# Patient Record
Sex: Female | Born: 1970 | Race: Black or African American | Hispanic: No | Marital: Single | Smoking: Never smoker
Health system: Southern US, Community
[De-identification: ages and names within clinical notes are randomized; demographics above are authoritative.]

## PROBLEM LIST (undated history)

## (undated) DIAGNOSIS — Z806 Family history of leukemia: Secondary | ICD-10-CM

## (undated) DIAGNOSIS — Z8041 Family history of malignant neoplasm of ovary: Secondary | ICD-10-CM

## (undated) DIAGNOSIS — Z800 Family history of malignant neoplasm of digestive organs: Secondary | ICD-10-CM

## (undated) DIAGNOSIS — E785 Hyperlipidemia, unspecified: Secondary | ICD-10-CM

## (undated) DIAGNOSIS — Z807 Family history of other malignant neoplasms of lymphoid, hematopoietic and related tissues: Secondary | ICD-10-CM

## (undated) DIAGNOSIS — D649 Anemia, unspecified: Secondary | ICD-10-CM

## (undated) DIAGNOSIS — Z803 Family history of malignant neoplasm of breast: Secondary | ICD-10-CM

## (undated) HISTORY — PX: ROOT CANAL: SHX2363

## (undated) HISTORY — DX: Family history of malignant neoplasm of digestive organs: Z80.0

## (undated) HISTORY — DX: Family history of other malignant neoplasms of lymphoid, hematopoietic and related tissues: Z80.7

## (undated) HISTORY — DX: Family history of malignant neoplasm of ovary: Z80.41

## (undated) HISTORY — DX: Anemia, unspecified: D64.9

## (undated) HISTORY — DX: Hyperlipidemia, unspecified: E78.5

## (undated) HISTORY — DX: Family history of leukemia: Z80.6

## (undated) HISTORY — DX: Family history of malignant neoplasm of breast: Z80.3

## (undated) HISTORY — PX: PARTIAL HYSTERECTOMY: SHX80

---
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
## 1997-10-01 DIAGNOSIS — I82409 Acute embolism and thrombosis of unspecified deep veins of unspecified lower extremity: Secondary | ICD-10-CM

## 1997-10-01 HISTORY — DX: Acute embolism and thrombosis of unspecified deep veins of unspecified lower extremity: I82.409

## 2020-08-01 DIAGNOSIS — T7840XA Allergy, unspecified, initial encounter: Secondary | ICD-10-CM

## 2020-08-01 HISTORY — DX: Allergy, unspecified, initial encounter: T78.40XA

## 2020-08-30 ENCOUNTER — Other Ambulatory Visit: Payer: Self-pay

## 2020-08-30 ENCOUNTER — Encounter: Payer: Self-pay | Admitting: Allergy and Immunology

## 2020-08-30 ENCOUNTER — Ambulatory Visit: Payer: 59 | Admitting: Allergy and Immunology

## 2020-08-30 VITALS — BP 130/76 | HR 87 | Temp 97.9°F | Resp 16 | Ht 68.0 in | Wt 231.6 lb

## 2020-08-30 DIAGNOSIS — L299 Pruritus, unspecified: Secondary | ICD-10-CM

## 2020-08-30 DIAGNOSIS — T7800XA Anaphylactic reaction due to unspecified food, initial encounter: Secondary | ICD-10-CM

## 2020-08-30 MED ORDER — EPINEPHRINE 0.3 MG/0.3ML IJ SOAJ: 0.3000 mg | Freq: Once | INTRAMUSCULAR | 1 refills | Status: AC

## 2020-08-30 NOTE — Patient Instructions (Addendum)
Food allergy The patient's history suggests food allergy and positive skin test results today confirm this diagnosis.  Food allergen skin tests were reactive to soybean, walnut, almond, and hazelnut.  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  A prescription has been provided for epinephrine auto-injector 2 pack along with instructions for proper administration.  A food allergy action plan has been provided and discussed.  Medic Alert identification is recommended.   Return in about 1 year (around 08/30/2021), or if symptoms worsen or fail to improve.

## 2020-08-30 NOTE — Progress Notes (Signed)
New Patient Note  RE: Sandra Kelley MRN: [id number] DOB: 15-May-1971 Date of Office Visit: 08/30/2020  Referring provider: Jackie Plum, MD Primary care provider: Jackie Plum, MD  Chief Complaint: Allergic Reaction   History of present illness: Sandra Kelley is a 49 y.o. female seen today in consultation requested by Sandra Plum, MD.  She reports that she has been attempting to change her diet as she is close to turning 49 years old.  Considering these changes in her diet, she is interested in assessing her food allergy status with skin testing.  She reports that for many years she has experienced "extreme" abdominal discomfort and bloating when consuming soy.  Therefore, she has removed soy from her diet.  She denies concomitant urticaria, angioedema, cardiopulmonary symptoms, nausea, and vomiting.  She also reports that when she consumes tree nuts she experiences oral pruritus and her hard palate "bumps up."  At times, the symptoms become severe and she needs to rinse out her mouth with mouthwash.  For the last month, she has eliminated carbohydrates from her diet and will be introducing more whole grains and oats into her diet and would like to be tested for those as well.  Assessment and plan: Food allergy The patient's history suggests food allergy and positive skin test results today confirm this diagnosis.  Food allergen skin tests were reactive to soybean, walnut, almond, and hazelnut.  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  A prescription has been provided for epinephrine auto-injector 2 pack along with instructions for proper administration.  A food allergy action plan has been provided and discussed.  Medic Alert identification is recommended.   Meds ordered this encounter  Medications  . EPINEPHrine 0.3 mg/0.3 mL IJ SOAJ injection    Sig: Inject 0.3 mg into the muscle once for 1 dose.    Dispense:  1 each    Refill:  1     Diagnostics:  Food allergen skin testing: Reactive to soybean, walnut, almond, and hazelnut.    Physical examination: Blood pressure 130/76, pulse 87, temperature 97.9 F (36.6 C), temperature source Temporal, resp. rate 16, height 5\' 8"  (1.727 m), weight 231 lb 9.6 oz (105.1 kg), SpO2 100 %.  General: Alert, interactive, in no acute distress. Neck: Supple without lymphadenopathy. Lungs: Clear to auscultation without wheezing, rhonchi or rales. CV: Normal S1, S2 without murmurs. Abdomen: Nondistended, nontender. Skin: Warm and dry, without lesions or rashes. Extremities:  No clubbing, cyanosis or edema. Neuro:   Grossly intact.  Review of systems:  Review of systems negative except as noted in HPI / PMHx or noted below: Review of Systems  Constitutional: Negative.   HENT: Negative.   Eyes: Negative.   Respiratory: Negative.   Cardiovascular: Negative.   Gastrointestinal: Negative.   Genitourinary: Negative.   Musculoskeletal: Negative.   Skin: Negative.   Neurological: Negative.   Endo/Heme/Allergies: Negative.   Psychiatric/Behavioral: Negative.     Past medical history:  History reviewed. No pertinent past medical history.  Past surgical history:  Past Surgical History:  Procedure Laterality Date  . PARTIAL HYSTERECTOMY    . ROOT CANAL      Family history: Family History  Problem Relation Age of Onset  . Allergic rhinitis Sister   . Food Allergy Sister   . Asthma Neg Hx   . Angioedema Neg Hx   . Eczema Neg Hx   . Immunodeficiency Neg Hx   . Urticaria Neg Hx     Social history:  Social History   Socioeconomic History  . Marital status: Single    Spouse name: Not on file  . Number of children: Not on file  . Years of education: Not on file  . Highest education level: Not on file  Occupational History  . Not on file  Tobacco Use  . Smoking status: Never Smoker  . Smokeless tobacco: Never Used  Vaping Use  . Vaping Use: Never used  Substance  and Sexual Activity  . Alcohol use: Never  . Drug use: Never  . Sexual activity: Not on file  Other Topics Concern  . Not on file  Social History Narrative  . Not on file   Social Determinants of Health   Financial Resource Strain:   . Difficulty of Paying Living Expenses: Not on file  Food Insecurity:   . Worried About Programme researcher, broadcasting/film/video in the Last Year: Not on file  . Ran Out of Food in the Last Year: Not on file  Transportation Needs:   . Lack of Transportation (Medical): Not on file  . Lack of Transportation (Non-Medical): Not on file  Physical Activity:   . Days of Exercise per Week: Not on file  . Minutes of Exercise per Session: Not on file  Stress:   . Feeling of Stress : Not on file  Social Connections:   . Frequency of Communication with Friends and Family: Not on file  . Frequency of Social Gatherings with Friends and Family: Not on file  . Attends Religious Services: Not on file  . Active Member of Clubs or Organizations: Not on file  . Attends Banker Meetings: Not on file  . Marital Status: Not on file  Intimate Partner Violence:   . Fear of Current or Ex-Partner: Not on file  . Emotionally Abused: Not on file  . Physically Abused: Not on file  . Sexually Abused: Not on file    Environmental History: The patient lives in a 49 year old house with laminate floors throughout and central air/heat.  There is mold/water damage in the home.  There are no pets in the home.  She is a never smoker.  Current Outpatient Medications  Medication Sig Dispense Refill  . Melatonin 1 MG CHEW Chew by mouth daily as needed.    . phentermine (ADIPEX-P) 37.5 MG tablet Take 37.5 mg by mouth at bedtime.    . Vitamin D, Ergocalciferol, (DRISDOL) 1.25 MG (50000 UNIT) CAPS capsule Take 50,000 Units by mouth once a week.    Marland Kitchen EPINEPHrine 0.3 mg/0.3 mL IJ SOAJ injection Inject 0.3 mg into the muscle once for 1 dose. 1 each 1   No current facility-administered  medications for this visit.    Known medication allergies: Allergies  Allergen Reactions  . Demerol [Meperidine Hcl] Itching    I appreciate the opportunity to take part in [facility] care. Please do not hesitate to contact me with questions.  Sincerely,   R. Jorene Guest, MD

## 2020-08-30 NOTE — Assessment & Plan Note (Addendum)
The patient's history suggests food allergy and positive skin test results today confirm this diagnosis.  Food allergen skin tests were reactive to soybean, walnut, almond, and hazelnut.  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  A prescription has been provided for epinephrine auto-injector 2 pack along with instructions for proper administration.  A food allergy action plan has been provided and discussed.  Medic Alert identification is recommended.

## 2020-09-02 ENCOUNTER — Other Ambulatory Visit: Payer: Self-pay | Admitting: Obstetrics and Gynecology

## 2020-09-02 DIAGNOSIS — N6320 Unspecified lump in the left breast, unspecified quadrant: Secondary | ICD-10-CM

## 2020-09-13 ENCOUNTER — Other Ambulatory Visit: Payer: 59

## 2020-09-19 ENCOUNTER — Ambulatory Visit
Admission: RE | Admit: 2020-09-19 | Discharge: 2020-09-19 | Disposition: A | Discharge status: Home / Self Care | Payer: 59 | Source: Ambulatory Visit | Attending: Obstetrics and Gynecology | Admitting: Obstetrics and Gynecology

## 2020-09-19 ENCOUNTER — Other Ambulatory Visit: Payer: Self-pay | Admitting: Obstetrics and Gynecology

## 2020-09-19 ENCOUNTER — Other Ambulatory Visit: Payer: Self-pay

## 2020-09-19 DIAGNOSIS — N6320 Unspecified lump in the left breast, unspecified quadrant: Secondary | ICD-10-CM

## 2020-10-09 ENCOUNTER — Emergency Department (HOSPITAL_BASED_OUTPATIENT_CLINIC_OR_DEPARTMENT_OTHER)
Admission: EM | Admit: 2020-10-09 | Discharge: 2020-10-09 | Disposition: A | Discharge status: Home / Self Care | Payer: 59 | Attending: Emergency Medicine | Admitting: Emergency Medicine

## 2020-10-09 ENCOUNTER — Other Ambulatory Visit: Payer: Self-pay

## 2020-10-09 ENCOUNTER — Encounter (HOSPITAL_BASED_OUTPATIENT_CLINIC_OR_DEPARTMENT_OTHER): Payer: Self-pay

## 2020-10-09 DIAGNOSIS — S161XXA Strain of muscle, fascia and tendon at neck level, initial encounter: Secondary | ICD-10-CM | POA: Insufficient documentation

## 2020-10-09 DIAGNOSIS — S199XXA Unspecified injury of neck, initial encounter: Secondary | ICD-10-CM | POA: Diagnosis present

## 2020-10-09 DIAGNOSIS — X503XXA Overexertion from repetitive movements, initial encounter: Secondary | ICD-10-CM | POA: Diagnosis not present

## 2020-10-09 DIAGNOSIS — M62838 Other muscle spasm: Secondary | ICD-10-CM | POA: Diagnosis not present

## 2020-10-09 MED ORDER — KETOROLAC TROMETHAMINE 60 MG/2ML IM SOLN
60.0000 mg | Freq: Once | INTRAMUSCULAR | Status: AC
  Administered 2020-10-09: 60 mg via INTRAMUSCULAR
  Filled 2020-10-09: qty 2

## 2020-10-09 MED ORDER — METHYLPREDNISOLONE 4 MG PO TBPK: ORAL_TABLET | ORAL | 0 refills | Status: DC

## 2020-10-09 MED ORDER — IBUPROFEN 800 MG PO TABS: 800.0000 mg | ORAL_TABLET | Freq: Three times a day (TID) | ORAL | 0 refills | Status: DC | PRN

## 2020-10-09 MED ORDER — CYCLOBENZAPRINE HCL 10 MG PO TABS: 10.0000 mg | ORAL_TABLET | Freq: Two times a day (BID) | ORAL | 0 refills | Status: DC | PRN

## 2020-10-09 NOTE — ED Triage Notes (Addendum)
Pt arrives ambulatory to ED with c/o pain to left neck pt arrives with heat pack to left neck reports she will be getting a MRI tomorrow. States that the area is shooting into her head and back. SHe has used moist heat, and massage gun, also has acupuncture set up for later in the week.

## 2020-10-09 NOTE — Discharge Instructions (Addendum)
Overall suspect you have a muscle spasm.  Take 800 mg of Motrin every 8 hours for the next 5 days and then as needed.  Take 1000 mg of Tylenol every 6 hours for the next 5 days and as needed.  Take muscle relaxant Flexeril as prescribed but do not mix with any other alcohol or drugs as this can make you sleepy.  Do not drive while taking this medicine as well.  Take Medrol Dosepak as prescribed.

## 2020-10-09 NOTE — ED Provider Notes (Signed)
[facility] EMERGENCY DEPARTMENT Provider Note   CSN: [id number] Arrival date & time: 10/09/20  1736     History Chief Complaint  Patient presents with  . Neck Pain    Sandra Kelley is a 50 y.o. female.  The history is provided by the patient.  Neck Pain Pain location:  L side Quality:  Aching Pain radiates to:  Does not radiate Pain severity:  Mild Onset quality:  Gradual Timing:  Intermittent Progression:  Waxing and waning Chronicity:  New Relieved by:  Heat Worsened by:  Twisting Associated symptoms: no bladder incontinence, no chest pain, no fever, no headaches, no numbness, no tingling and no weakness        History reviewed. No pertinent past medical history.  Patient Active Problem List   Diagnosis Date Noted  . Food allergy 08/30/2020    Past Surgical History:  Procedure Laterality Date  . PARTIAL HYSTERECTOMY    . ROOT CANAL       OB History   No obstetric history on file.     Family History  Problem Relation Age of Onset  . Allergic rhinitis Sister   . Food Allergy Sister   . Asthma Neg Hx   . Angioedema Neg Hx   . Eczema Neg Hx   . Immunodeficiency Neg Hx   . Urticaria Neg Hx     Social History   Tobacco Use  . Smoking status: Never Smoker  . Smokeless tobacco: Never Used  Vaping Use  . Vaping Use: Never used  Substance Use Topics  . Alcohol use: Never  . Drug use: Never    Home Medications Prior to Admission medications   Medication Sig Start Date End Date Taking? Authorizing Provider  cyclobenzaprine (FLEXERIL) 10 MG tablet Take 1 tablet (10 mg total) by mouth 2 (two) times daily as needed for muscle spasms. 10/09/20  Yes Shakiyah Cirilo, DO  ibuprofen (ADVIL) 800 MG tablet Take 1 tablet (800 mg total) by mouth every 8 (eight) hours as needed for up to 30 doses. 10/09/20  Yes Naomii Kreger, DO  methylPREDNISolone (MEDROL DOSEPAK) 4 MG TBPK tablet Follow package insert 10/09/20  Yes Jakala Herford, DO  Melatonin 1 MG  CHEW Chew by mouth daily as needed.    [provider]  phentermine (ADIPEX-P) 37.5 MG tablet Take 37.5 mg by mouth at bedtime. 08/02/20   [provider]  Vitamin D, Ergocalciferol, (DRISDOL) 1.25 MG (50000 UNIT) CAPS capsule Take 50,000 Units by mouth once a week. 08/29/20   [provider]    Allergies    Demerol [meperidine hcl]  Review of Systems   Review of Systems  Constitutional: Negative for chills and fever.  HENT: Negative for ear pain and sore throat.   Eyes: Negative for pain and visual disturbance.  Respiratory: Negative for cough and shortness of breath.   Cardiovascular: Negative for chest pain and palpitations.  Gastrointestinal: Negative for abdominal pain and vomiting.  Genitourinary: Negative for bladder incontinence, dysuria and hematuria.  Musculoskeletal: Positive for neck pain and neck stiffness. Negative for arthralgias and back pain.  Skin: Negative for color change and rash.  Neurological: Negative for dizziness, tingling, tremors, seizures, syncope, facial asymmetry, speech difficulty, weakness, light-headedness, numbness and headaches.  All other systems reviewed and are negative.   Physical Exam Updated Vital Signs  ED Triage Vitals  Enc Vitals Group     BP 10/09/20 1749 130/88     Pulse Rate 10/09/20 1749 (!) 107  Resp 10/09/20 1749 18     Temp 10/09/20 1749 99.3 F (37.4 C)     Temp Source 10/09/20 1749 Oral     SpO2 10/09/20 1749 95 %     Weight 10/09/20 1747 225 lb (102.1 kg)     Height 10/09/20 1747 5\' 8"  (1.727 m)     Head Circumference --      Peak Flow --      Pain Score 10/09/20 1747 10     Pain Loc --      Pain Edu? --      Excl. in GC? --     Physical Exam Constitutional:      General: She is not in acute distress.    Appearance: She is not ill-appearing.  Neck:     Comments: No midline spinal tenderness, decreased range of motion due to pain, increased muscle tone in the left trapezius area and  tenderness in the left paraspinal muscles consistent with strain/spasm Cardiovascular:     Pulses: Normal pulses.  Musculoskeletal:     Cervical back: Tenderness present.  Skin:    General: Skin is warm.  Neurological:     General: No focal deficit present.     Mental Status: She is alert and oriented to person, place, and time.     Cranial Nerves: No cranial nerve deficit.     Sensory: No sensory deficit.     Motor: No weakness.     Coordination: Coordination normal.     Comments: 5+ out of 5 strength in upper extremities, normal sensation in the upper extremities     ED Results / Procedures / Treatments   Labs (all labs ordered are listed, but only abnormal results are displayed) Labs Reviewed - No data to display  EKG None  Radiology No results found.  Procedures Procedures (including critical care time)  Medications Ordered in ED Medications  ketorolac (TORADOL) injection 60 mg (has no administration in time range)    ED Course  I have reviewed the triage vital signs and the nursing notes.  Pertinent labs & imaging results that were available during my care of the patient were reviewed by me and considered in my medical decision making (see chart for details).    MDM Rules/Calculators/A&P                          [name] is here with neck strain.  Normal vitals.  No fever.  No specific injury.  Neck has locked up on her the last several days.  Worse tonight.  Is set for an MRI of this week with primary care doctor.  Neuromuscularly are neurovascular intact on exam.  Normal neurological exam.  Normal strength and sensation in the upper extremities.  No concern for meningitis or any infectious process.  Not having any headache.  No midline spinal tenderness.  In the setting of no trauma doubt fracture.  Suspect muscle spasm from irritated nerve root.  Will prescribe muscle relaxant, steroids, Motrin.  Given a shot of Toradol in the ED.  Given reassurance and  understands return precautions and discharged in ED in good condition.  This chart was dictated using voice recognition software.  Despite best efforts to proofread,  errors can occur which can change the documentation meaning.    Final Clinical Impression(s) / ED Diagnoses Final diagnoses:  Acute strain of neck muscle, initial encounter  Muscle spasms of neck    Rx /  DC Orders ED Discharge Orders         Ordered    ibuprofen (ADVIL) 800 MG tablet  Every 8 hours PRN        10/09/20 2131    cyclobenzaprine (FLEXERIL) 10 MG tablet  2 times daily PRN        10/09/20 2131    methylPREDNISolone (MEDROL DOSEPAK) 4 MG TBPK tablet        10/09/20 2131           Virgina Norfolk, DO 10/09/20 2141

## 2020-10-21 ENCOUNTER — Encounter: Payer: Self-pay | Admitting: Gastroenterology

## 2020-10-25 DIAGNOSIS — E559 Vitamin D deficiency, unspecified: Secondary | ICD-10-CM | POA: Insufficient documentation

## 2020-10-25 DIAGNOSIS — E669 Obesity, unspecified: Secondary | ICD-10-CM | POA: Insufficient documentation

## 2020-10-25 DIAGNOSIS — E782 Mixed hyperlipidemia: Secondary | ICD-10-CM | POA: Insufficient documentation

## 2020-10-31 ENCOUNTER — Ambulatory Visit (AMBULATORY_SURGERY_CENTER): Payer: 59

## 2020-10-31 ENCOUNTER — Telehealth: Payer: Self-pay

## 2020-10-31 VITALS — Ht 68.0 in | Wt 225.0 lb

## 2020-10-31 DIAGNOSIS — Z01818 Encounter for other preprocedural examination: Secondary | ICD-10-CM

## 2020-10-31 DIAGNOSIS — Z1211 Encounter for screening for malignant neoplasm of colon: Secondary | ICD-10-CM

## 2020-10-31 DIAGNOSIS — Z800 Family history of malignant neoplasm of digestive organs: Secondary | ICD-10-CM

## 2020-10-31 MED ORDER — NA SULFATE-K SULFATE-MG SULF 17.5-3.13-1.6 GM/177ML PO SOLN: 1.0000 | Freq: Once | ORAL | 0 refills | Status: AC

## 2020-10-31 NOTE — Telephone Encounter (Signed)
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  Pt has not needed to use the epi-pen.   I let her know we ask that due to the anesthesia used and she states she would prefer fentanyl and Versed.  I told her that the CRNA will decide this, but wanted to let you know.

## 2020-10-31 NOTE — Progress Notes (Signed)
No egg allergy known to patient   Pt states she was recently tested for allergies and was told she has a SEVERE ALLERGY to Soy, carries an epipen but has not had to use it.  Message to Cathlyn Parsons, CRNA  No issues with past sedation with any surgeries or procedures No intubation problems in the past  No FH of Malignant Hyperthermia Pt states she will stop diet pills today. No home 02 use per patient  No blood thinners per patient  Pt denies issues with constipation  No A fib or A flutter  EMMI video to pt or via MyChart  COVID 19 guidelines implemented in PV today with Pt and RN   Pt is not fully vaccinated  for Covid  Scheduled covid test Wed. 2/9  Suprep Coupon given to pt in PV today , Code to Pharmacy and  NO PA's for preps discussed with pt  In PV today   Due to the COVID-19 pandemic we are asking patients to follow certain guidelines.  Pt aware of COVID protocols and [facility] guidelines

## 2020-11-01 NOTE — Telephone Encounter (Signed)
Mardene Celeste,  Thanks for the notification.  Cathlyn Parsons

## 2020-11-02 NOTE — Telephone Encounter (Signed)
Thanks for the update. Will defer sedation modality to [place] service.

## 2020-11-09 ENCOUNTER — Other Ambulatory Visit: Payer: Self-pay | Admitting: Gastroenterology

## 2020-11-09 LAB — SARS CORONAVIRUS 2 (TAT 6-24 HRS): SARS Coronavirus 2: NEGATIVE

## 2020-11-10 ENCOUNTER — Other Ambulatory Visit: Payer: Self-pay

## 2020-11-10 ENCOUNTER — Emergency Department (HOSPITAL_BASED_OUTPATIENT_CLINIC_OR_DEPARTMENT_OTHER)
Admission: EM | Admit: 2020-11-10 | Discharge: 2020-11-10 | Disposition: A | Discharge status: Home / Self Care | Payer: 59 | Attending: Emergency Medicine | Admitting: Emergency Medicine

## 2020-11-10 ENCOUNTER — Encounter (HOSPITAL_BASED_OUTPATIENT_CLINIC_OR_DEPARTMENT_OTHER): Payer: Self-pay

## 2020-11-10 DIAGNOSIS — Z86718 Personal history of other venous thrombosis and embolism: Secondary | ICD-10-CM | POA: Diagnosis not present

## 2020-11-10 DIAGNOSIS — T161XXA Foreign body in right ear, initial encounter: Secondary | ICD-10-CM | POA: Diagnosis present

## 2020-11-10 DIAGNOSIS — Z7982 Long term (current) use of aspirin: Secondary | ICD-10-CM | POA: Diagnosis not present

## 2020-11-10 DIAGNOSIS — X58XXXA Exposure to other specified factors, initial encounter: Secondary | ICD-10-CM | POA: Diagnosis not present

## 2020-11-10 NOTE — ED Notes (Signed)
ED Provider at bedside. 

## 2020-11-10 NOTE — Discharge Instructions (Signed)
Please make sure to follow-up with ear nose and throat doctor provided in your discharge paperwork.  Return to the ER for severe or worsening pain, or any other new or worsening symptoms.

## 2020-11-10 NOTE — ED Provider Notes (Signed)
[facility] EMERGENCY DEPARTMENT Provider Note   CSN: [id number] Arrival date & time: 11/10/20  2004     History Chief Complaint  Patient presents with  . Foreign Body in Ear    Sandra Kelley is a 50 y.o. female.  HPI 50 year old female presenting with a which she suspects is a Q-tip tip in her right ear.  Reports that she was using this last night in her ear and when she pulled out the swab, that end of it was missing.  She reports some muffled hearing slightly with some changes in pressure, however no severe pain, no significant hearing loss.  She has attempted to get it out with a pipette and a suction cup but with little relief.    Past Medical History:  Diagnosis Date  . 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 Anemia    past hx IDA  . DVT complicating pregnancy, third trimester 1991   right hip area  . DVT of lower extremity (deep venous thrombosis) (HCC) 1999   states behind right knee  . Hyperlipidemia     Patient Active Problem List   Diagnosis Date Noted  . Vitamin D deficiency 10/25/2020  . Obesity 10/25/2020  . Mixed hyperlipidemia 10/25/2020  . Food allergy 08/30/2020    Past Surgical History:  Procedure Laterality Date  . PARTIAL HYSTERECTOMY    . ROOT CANAL       OB History   No obstetric history on file.     Family History  Problem Relation Age of Onset  . Allergic rhinitis Sister   . Food Allergy Sister   . Colon cancer Maternal Aunt   . Colon cancer Maternal Uncle   . Colon cancer Other   . Asthma Neg Hx   . Angioedema Neg Hx   . Eczema Neg Hx   . Immunodeficiency Neg Hx   . Urticaria Neg Hx   . Colon polyps Neg Hx   . Esophageal cancer Neg Hx   . Rectal cancer Neg Hx   . Stomach cancer Neg Hx     Social History   Tobacco Use  . Smoking status: Never Smoker  . Smokeless tobacco: Never Used  Vaping Use  . Vaping Use: Never used  Substance Use Topics  .  Alcohol use: Never  . Drug use: Never    Home Medications Prior to Admission medications   Medication Sig Start Date End Date Taking? Authorizing Provider  aspirin EC 81 MG tablet Take 81 mg by mouth daily. Swallow whole.    [provider]  cyclobenzaprine (FLEXERIL) 10 MG tablet Take 1 tablet (10 mg total) by mouth 2 (two) times daily as needed for muscle spasms. 10/09/20   Curatolo, Adam, DO  diclofenac (VOLTAREN) 75 MG EC tablet Take 75 mg by mouth 2 (two) times daily. Patient not taking: Reported on 10/31/2020 10/10/20   [provider]  EPINEPHrine 0.3 mg/0.3 mL IJ SOAJ injection Use prn for allergic reaction and accidental ingestion of soy or tree nuts. 09/02/20   [provider]  gabapentin (NEURONTIN) 300 MG capsule 1 cap(s) Patient not taking: Reported on 10/31/2020 10/10/20   [provider]  ibuprofen (ADVIL) 800 MG tablet Take 1 tablet (800 mg total) by mouth every 8 (eight) hours as needed for up to 30 doses. 10/09/20   Curatolo, Adam, DO  Melatonin 1 MG CHEW Chew by mouth daily as  needed.    [provider]  methylPREDNISolone (MEDROL DOSEPAK) 4 MG TBPK tablet Follow package insert 10/09/20   Curatolo, Adam, DO  phentermine (ADIPEX-P) 37.5 MG tablet Take 37.5 mg by mouth daily before breakfast. 08/02/20   [provider]  Vitamin D, Ergocalciferol, (DRISDOL) 1.25 MG (50000 UNIT) CAPS capsule Take 50,000 Units by mouth once a week. 08/29/20   [provider]    Allergies    Demerol [meperidine hcl] and Other  Review of Systems   Review of Systems  HENT: Negative for ear discharge and ear pain.        Foreign body sensation in right ear    Physical Exam Updated Vital Signs BP (!) 158/100 (BP Location: Left Arm)   Pulse 92   Temp 97.7 F (36.5 C) (Oral)   Resp 18   Ht 5\' 8"  (1.727 m)   Wt 102.5 kg   SpO2 100%   BMI 34.36 kg/m   Physical Exam Vitals reviewed.  Constitutional:      Appearance: Normal  appearance.  HENT:     Head: Normocephalic and atraumatic.     Ears:     Comments: Right TM obstructed with what appears to be part of the tip of a Q-tip.  TM not visible.  No tragal tenderness.  Left TM without erythema, swelling, intact Eyes:     General:        Right eye: No discharge.        Left eye: No discharge.     Extraocular Movements: Extraocular movements intact.     Conjunctiva/sclera: Conjunctivae normal.  Musculoskeletal:        General: No swelling. Normal range of motion.  Neurological:     General: No focal deficit present.     Mental Status: She is alert and oriented to person, place, and time.  Psychiatric:        Mood and Affect: Mood normal.        Behavior: Behavior normal.     ED Results / Procedures / Treatments   Labs (all labs ordered are listed, but only abnormal results are displayed) Labs Reviewed - No data to display  EKG None  Radiology No results found.  Procedures Procedures   Medications Ordered in ED Medications - No data to display  ED Course  I have reviewed the triage vital signs and the nursing notes.  Pertinent labs & imaging results that were available during my care of the patient were reviewed by me and considered in my medical decision making (see chart for details).    MDM Rules/Calculators/A&P                          50 year old female presents to the ER with 1 day of which she suspects is a Q-tip tip in her ear.  Unfortunately we were not able to remove this with alligator forceps.  There is no signs of infection at this time.  We discussed ENT referral which the patient is agreeable to.  We discussed return precautions, which included worsening pain, fevers, hearing loss etc.  She voiced understanding is agreeable.  Stable for discharge at this time.  This was a shared visit with my supervising physician Dr. [id number] who independently saw and evaluated the patient & provided guidance in  evaluation/management/disposition ,in agreement with care  Final Clinical Impression(s) / ED Diagnoses Final diagnoses:  Foreign body of right ear, initial encounter    Rx /  DC Orders ED Discharge Orders    None       Leone Brand 11/10/20 2120    Pricilla Loveless, MD 11/10/20 2211

## 2020-11-10 NOTE — ED Triage Notes (Signed)
Pt c/o cotton tip swab in right ear since last night-NAD-steady gait

## 2020-11-14 ENCOUNTER — Telehealth: Payer: Self-pay | Admitting: General Surgery

## 2020-11-14 ENCOUNTER — Other Ambulatory Visit: Payer: Self-pay

## 2020-11-14 ENCOUNTER — Ambulatory Visit (AMBULATORY_SURGERY_CENTER): Payer: 59 | Admitting: Gastroenterology

## 2020-11-14 ENCOUNTER — Encounter: Payer: Self-pay | Admitting: Gastroenterology

## 2020-11-14 VITALS — BP 131/80 | HR 77 | Temp 97.1°F | Resp 14 | Ht 68.0 in | Wt 225.0 lb

## 2020-11-14 DIAGNOSIS — Z1211 Encounter for screening for malignant neoplasm of colon: Secondary | ICD-10-CM

## 2020-11-14 DIAGNOSIS — Z800 Family history of malignant neoplasm of digestive organs: Secondary | ICD-10-CM

## 2020-11-14 DIAGNOSIS — K640 First degree hemorrhoids: Secondary | ICD-10-CM

## 2020-11-14 DIAGNOSIS — K5730 Diverticulosis of large intestine without perforation or abscess without bleeding: Secondary | ICD-10-CM

## 2020-11-14 MED ORDER — SODIUM CHLORIDE 0.9 % IV SOLN: 500.0000 mL | INTRAVENOUS | Status: DC

## 2020-11-14 NOTE — Progress Notes (Signed)
Report to PACU, RN, vss, BBS= Clear.  

## 2020-11-14 NOTE — Op Note (Signed)
[facility] Patient Name: Sandra Kelley Procedure Date: 11/14/2020 8:39 AM MRN: [id number] Endoscopist: Doristine Locks , MD Age: 50 Referring MD:  Date of Birth: 09/23/1971 Gender: Female Account #: [id number] Procedure:                Colonoscopy Indications:              Colon cancer screening in patient at increased                            risk: Family history of colorectal cancer in                            multiple 2nd degree relatives Medicines:                Monitored Anesthesia Care Procedure:                Pre-Anesthesia Assessment:                           - Prior to the procedure, a History and Physical                            was performed, and patient medications and                            allergies were reviewed. The patient's tolerance of                            previous anesthesia was also reviewed. The risks                            and benefits of the procedure and the sedation                            options and risks were discussed with the patient.                            All questions were answered, and informed consent                            was obtained. Prior Anticoagulants: The patient has                            taken no previous anticoagulant or antiplatelet                            agents. ASA Grade Assessment: II - A patient with                            mild systemic disease. After reviewing the risks                            and benefits, the patient was deemed in  satisfactory condition to undergo the procedure.                           After obtaining informed consent, the colonoscope                            was passed under direct vision. Throughout the                            procedure, the patient's blood pressure, pulse, and                            oxygen saturations were monitored continuously. The                            Olympus CF-[id number])  Colonoscope was                            introduced through the anus and advanced to the the                            terminal ileum. The colonoscopy was performed                            without difficulty. The patient tolerated the                            procedure well. The quality of the bowel                            preparation was excellent. The terminal ileum,                            ileocecal valve, appendiceal orifice, and rectum                            were photographed. Scope In: 8:49:37 AM Scope Out: 9:03:11 AM Scope Withdrawal Time: 0 hours 10 minutes 31 seconds  Total Procedure Duration: 0 hours 13 minutes 34 seconds  Findings:                 The perianal and digital rectal examinations were                            normal.                           A few small-mouthed diverticula were found in the                            sigmoid colon and ascending colon.                           Non-bleeding internal hemorrhoids were found during  retroflexion. The hemorrhoids were small and Grade                            I (internal hemorrhoids that do not prolapse).                           The exam was otherwise normal throughout the                            examined colon.                           The terminal ileum appeared normal. Complications:            No immediate complications. Estimated Blood Loss:     Estimated blood loss: none. Impression:               - Diverticulosis in the sigmoid colon and in the                            ascending colon.                           - Non-bleeding internal hemorrhoids.                           - The examined portion of the ileum was normal.                           - No specimens collected. Recommendation:           - Patient has a contact number available for                            emergencies. The signs and symptoms of potential                            delayed  complications were discussed with the                            patient. Return to normal activities tomorrow.                            Written discharge instructions were provided to the                            patient.                           - Resume previous diet.                           - Continue present medications.                           - Repeat colonoscopy in 5 years for screening  purposes due to family history.                           - Return to GI office as needed.                           - Proceed with evaluation at the [facility] as                            scheduled. Doristine Locks, MD 11/14/2020 9:18:45 AM

## 2020-11-14 NOTE — Patient Instructions (Signed)
Please read handouts provided. Continue present medications. Return to GI office as needed. Proceed with evaluation at the [facility] as scheduled.     YOU HAD AN ENDOSCOPIC PROCEDURE TODAY AT THE [facility]:   Refer to the procedure report that was given to you for any specific questions about what was found during the examination.  If the procedure report does not answer your questions, please call your gastroenterologist to clarify.  If you requested that your care partner not be given the details of your procedure findings, then the procedure report has been included in a sealed envelope for you to review at your convenience later.  YOU SHOULD EXPECT: Some feelings of bloating in the abdomen. Passage of more gas than usual.  Walking can help get rid of the air that was put into your GI tract during the procedure and reduce the bloating. If you had a lower endoscopy (such as a colonoscopy or flexible sigmoidoscopy) you may notice spotting of blood in your stool or on the toilet paper. If you underwent a bowel prep for your procedure, you may not have a normal bowel movement for a few days.  Please Note:  You might notice some irritation and congestion in your nose or some drainage.  This is from the oxygen used during your procedure.  There is no need for concern and it should clear up in a day or so.  SYMPTOMS TO REPORT IMMEDIATELY:   Following lower endoscopy (colonoscopy or flexible sigmoidoscopy):  Excessive amounts of blood in the stool  Significant tenderness or worsening of abdominal pains  Swelling of the abdomen that is new, acute  Fever of 100F or higher    For urgent or emergent issues, a gastroenterologist can be reached at any hour by calling [phone]. Do not use MyChart messaging for urgent concerns.    DIET:  We do recommend a small meal at first, but then you may proceed to your regular diet.  Drink plenty of fluids but you should avoid  alcoholic beverages for 24 hours.  ACTIVITY:  You should plan to take it easy for the rest of today and you should NOT DRIVE or use heavy machinery until tomorrow (because of the sedation medicines used during the test).    FOLLOW UP: Our staff will call the number listed on your records 48-72 hours following your procedure to check on you and address any questions or concerns that you may have regarding the information given to you following your procedure. If we do not reach you, we will leave a message.  We will attempt to reach you two times.  During this call, we will ask if you have developed any symptoms of COVID 19. If you develop any symptoms (ie: fever, flu-like symptoms, shortness of breath, cough etc.) before then, please call [phone].  If you test positive for Covid 19 in the 2 weeks post procedure, please call and report this information to [place].    If any biopsies were taken you will be contacted by phone or by letter within the next 1-3 weeks.  Please call us at [phone] if you have not heard about the biopsies in 3 weeks.    SIGNATURES/CONFIDENTIALITY: You and/or your care partner have signed paperwork which will be entered into your electronic medical record.  These signatures attest to the fact that that the information above on your After Visit Summary has been reviewed and is understood.  Full responsibility of the confidentiality of  this discharge information lies with you and/or your care-partner.

## 2020-11-14 NOTE — Telephone Encounter (Signed)
The patient was scheduled for appointment after genetic testing on 12/12/2020 @1 :40.

## 2020-11-14 NOTE — Telephone Encounter (Signed)
-----   Message from Sandra Cleverly, DO sent at 11/14/2020  2:27 PM EST ----- Patient needs f/u appt with me sometime mid March for Fhx of stomach and colon CA. She has appt with Genetics this month and will need time for that work-up and results. Thanks.

## 2020-11-16 ENCOUNTER — Telehealth: Payer: Self-pay

## 2020-11-16 NOTE — Telephone Encounter (Signed)
No answer, left message to call back later today, B.Kitrina Maurin RN. 

## 2020-11-16 NOTE — Telephone Encounter (Signed)
LVM

## 2020-12-12 ENCOUNTER — Ambulatory Visit: Payer: 59 | Admitting: Gastroenterology

## 2020-12-20 ENCOUNTER — Encounter: Payer: Self-pay | Admitting: Gastroenterology

## 2020-12-20 ENCOUNTER — Ambulatory Visit: Payer: 59 | Admitting: Gastroenterology

## 2020-12-20 ENCOUNTER — Other Ambulatory Visit: Payer: Self-pay

## 2020-12-20 VITALS — BP 120/82 | HR 79 | Ht 68.0 in | Wt 229.0 lb

## 2020-12-20 DIAGNOSIS — Z800 Family history of malignant neoplasm of digestive organs: Secondary | ICD-10-CM

## 2020-12-20 MED ORDER — DICYCLOMINE HCL 10 MG PO CAPS: 10.0000 mg | ORAL_CAPSULE | Freq: Four times a day (QID) | ORAL | 3 refills | Status: DC | PRN

## 2020-12-20 NOTE — Patient Instructions (Signed)
If you are age 50 or older, your body mass index should be between 23-30. Your Body mass index is 34.82 kg/m. If this is out of the aforementioned range listed, please consider follow up with your Primary Care Provider.  If you are age 25 or younger, your body mass index should be between 19-25. Your Body mass index is 34.82 kg/m. If this is out of the aformentioned range listed, please consider follow up with your Primary Care Provider.

## 2020-12-20 NOTE — Progress Notes (Signed)
    P  Chief Complaint:    Procedure follow-up  HPI:     50 year old female with strong family history of multiple second-degree relatives with colon cancer, presenting to the [facility]astroenterology Clinic for procedure follow-up.  Initial colonoscopy completed last month and only notable for diverticulosis and internal hemorrhoids with plan for repeat in 5 years due to family history.  She has since undergone genetic testing through her GYN clinic, but no results back yet for review.  Patient was actually only seen very briefly in the waiting area just before exiting the office stating "I have another appointment and need to get to" (of note, checked in 14 minutes late then did not want to wait to be seen in the office). Essentially left without being seen fully.  Endoscopic History: -Colonoscopy (11/2020, Dr. Barron Alvine): Internal hemorrhoids, diverticulosis.  Otherwise normal.  Normal TI.  Repeat in 5 years due to family history.   Review of systems:     No chest pain, no SOB, no fevers, no urinary sx   Past Medical History:  Diagnosis Date  . 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 Anemia    past hx IDA  . DVT complicating pregnancy, third trimester 1991   right hip area  . DVT of lower extremity (deep venous thrombosis) (HCC) 1999   states behind right knee  . Hyperlipidemia     Patient's surgical history, family medical history, social history, medications and allergies were all reviewed in Epic    Current Outpatient Medications  Medication Sig Dispense Refill  . aspirin EC 81 MG tablet Take 81 mg by mouth daily. Swallow whole.    . phentermine (ADIPEX-P) 37.5 MG tablet Take 37.5 mg by mouth daily before breakfast.    . EPINEPHrine 0.3 mg/0.3 mL IJ SOAJ injection Use prn for allergic reaction and accidental ingestion of soy or tree nuts. (Patient not taking: No sig reported)     No current facility-administered  medications for this visit.    Physical Exam:     BP 120/82   Pulse 79   Ht 5\' 8"  (1.727 m)   Wt 229 lb (103.9 kg)   BMI 34.82 kg/m    IMPRESSION and PLAN:    1) Family history of colon cancer -Will obtain copy of her genetics report for review -Decision of whether or not to proceed with EGD or other diagnostic studies pending genetics evaluation -She can schedule follow-up appoint with me to review          ,DO, FACG 12/20/2020, 9:45 AM

## 2021-01-25 ENCOUNTER — Other Ambulatory Visit: Payer: Self-pay

## 2021-01-25 ENCOUNTER — Encounter: Payer: Self-pay | Admitting: Gastroenterology

## 2021-01-25 ENCOUNTER — Ambulatory Visit (INDEPENDENT_AMBULATORY_CARE_PROVIDER_SITE_OTHER): Payer: 59 | Admitting: Gastroenterology

## 2021-01-25 VITALS — BP 122/84 | HR 83 | Ht 68.0 in | Wt 226.2 lb

## 2021-01-25 DIAGNOSIS — Z1501 Genetic susceptibility to malignant neoplasm of breast: Secondary | ICD-10-CM | POA: Diagnosis not present

## 2021-01-25 DIAGNOSIS — R140 Abdominal distension (gaseous): Secondary | ICD-10-CM | POA: Diagnosis not present

## 2021-01-25 DIAGNOSIS — Z1589 Genetic susceptibility to other disease: Secondary | ICD-10-CM

## 2021-01-25 DIAGNOSIS — K439 Ventral hernia without obstruction or gangrene: Secondary | ICD-10-CM | POA: Diagnosis not present

## 2021-01-25 NOTE — Progress Notes (Signed)
P  Chief Complaint:    Review of genetics evaluation  GI History: 50 year old female with strong family history of multiple second-degree relatives with colon cancer.  Evaluation to date notable for the following: - Colonoscopy (11/2020, Dr. Barron Alvine): Internal hemorrhoids, diverticulosis.  Otherwise normal.  Normal TI.  Repeat in 5 years due to family history. 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 female presenting to the Gastroenterology Clinic for follow-up on recent genetic testing, with results as outlined above.  Does have post prandial distension of upper abdomen for many years. Not painful, but is bothersome. No HB, regurgitation. Tends to improves with BM. No dysphagia.   Was seen in the Allergy Clinic in December for tree nut allergy per patient.   Review of systems:     No chest pain, no SOB, no fevers, no urinary sx   Past Medical History:  Diagnosis Date  . 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 Anemia    past hx IDA  . DVT complicating pregnancy, third trimester 1991   right hip area  . DVT of lower extremity (deep venous thrombosis) (HCC) 1999   states behind right knee  . Hyperlipidemia     Patient's surgical history, family medical history, social history, medications and allergies were all reviewed in Epic    Current Outpatient Medications  Medication Sig Dispense Refill  . aspirin EC 81 MG tablet Take 81 mg by mouth daily. Swallow whole.    . phentermine (ADIPEX-P) 37.5 MG tablet Take 37.5 mg by mouth daily before breakfast.    . EPINEPHrine 0.3 mg/0.3 mL IJ SOAJ injection Use prn for allergic reaction and accidental ingestion of soy or tree nuts. (Patient not taking: Reported on 01/25/2021)     No current facility-administered medications for  this visit.    Physical Exam:     BP 122/84   Pulse 83   Ht 5\' 8"  (1.727 m)   Wt 226 lb 4 oz (102.6 kg)   BMI 34.40 kg/m   GENERAL:  Pleasant female in NAD PSYCH: : Cooperative, normal affect EENT:  conjunctiva pink, mucous membranes moist, neck supple without masses CARDIAC:  RRR, no murmur heard, no peripheral edema PULM: Normal respiratory effort, lungs CTA bilaterally, no wheezing ABDOMEN:  Ventral hernia. Mild TTP in MEG. Nondistended, soft, nontender. 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  Discussed PJS with increased risk for GI polyps.  Otherwise no pigmented lesions on abdominal exam. - [facility] for [facility] evaluation and discuss other non-GI screening strategies - Schedule EGD to evaluate for UGI polyps along with Video Capsule Endoscopy for small bowel polyps - If EGD and VCE unrevealing, per current guidelines, patients with PJS should have repeat EGD/colonoscopy/VCE every 3 years.  However, given the heterozygous mutation, would like input from the [facility] if this short interval screening program needs to  be implemented - Continue follow-up in the GYN clinic - From most recent International CAPS Consortium and AGA guidelines, screening for pancreatic cancer recommended in all patients with Peutz-Jeghers syndrome (carriers of a germline STK11/LKB1 gene mutation).  Again, would like input from the [facility] as PC screening protocol is quite invasive  2) Upper abdominal distention - Evaluate for mucosal/luminal pathology at time of EGD as above  3) Ventral  hernia - No obstructive symptoms.  No need to send for surgical evaluation at this juncture  The indications, risks, and benefits of EGD were explained to the patient in detail. Risks include but are not limited to bleeding, perforation, adverse reaction to medications, and cardiopulmonary compromise. Sequelae include but are not limited to the possibility of surgery, hospitalization, and mortality. The patient verbalized understanding and wished to proceed. All questions answered, referred to scheduler. Further recommendations pending results of the exam.   The indications, risks, and benefits of Video Capsule Endoscopy were explained to the patient in detail. Risks include but are not limited to capsule retention requiring endoscopic or surgical retrieval (occurs in 1-2%), failure to reach cecum prior to capsule battery expiration (ie, incomplete study), or inadequate visualization (ie, non-diagnostic study). The patient verbalized understanding and wished to proceed. All questions answered, referred to scheduler. Further recommendations pending results of the exam.           Verlin Dike Kashton Mcartor ,DO, FACG 01/25/2021, 11:00 AM

## 2021-01-25 NOTE — Patient Instructions (Signed)
If you are age 50 or older, your body mass index should be between 23-30. Your Body mass index is 34.4 kg/m. If this is out of the aforementioned range listed, please consider follow up with your Primary Care Provider.  If you are age 23 or younger, your body mass index should be between 19-25. Your Body mass index is 34.4 kg/m. If this is out of the aformentioned range listed, please consider follow up with your Primary Care Provider.   I will contact you today or tomorrow morning to schedule your video capsule endoscopy.  You will receive a call from the genetics clinic to schedule your consultation. If you do not hear from them within 2 weeks please contact our office.  Due to recent changes in healthcare laws, you may see the results of your imaging and laboratory studies on MyChart before your provider has had a chance to review them.  We understand that in some cases there may be results that are confusing or concerning to you. Not all laboratory results come back in the same time frame and the provider may be waiting for multiple results in order to interpret others.  Please give [place] 48 hours in order for your provider to thoroughly review all the results before contacting the office for clarification of your results.

## 2021-01-26 ENCOUNTER — Telehealth: Payer: Self-pay | Admitting: Licensed Clinical Social Worker

## 2021-01-26 NOTE — Telephone Encounter (Signed)
Received a genetic counseling referral from Dr. Barron Alvine for Monoallelic mutation of STK11 gene. Ms. Meaders returned my call and has been scheduled to see Brianna on 5/9 at 1pm. Letter mailed.

## 2021-02-06 ENCOUNTER — Other Ambulatory Visit: Payer: Self-pay

## 2021-02-06 ENCOUNTER — Encounter: Payer: Self-pay | Admitting: Licensed Clinical Social Worker

## 2021-02-06 ENCOUNTER — Inpatient Hospital Stay: Payer: 59 | Attending: Genetic Counselor | Admitting: Licensed Clinical Social Worker

## 2021-02-06 DIAGNOSIS — Z806 Family history of leukemia: Secondary | ICD-10-CM | POA: Insufficient documentation

## 2021-02-06 DIAGNOSIS — Z807 Family history of other malignant neoplasms of lymphoid, hematopoietic and related tissues: Secondary | ICD-10-CM | POA: Insufficient documentation

## 2021-02-06 DIAGNOSIS — Z803 Family history of malignant neoplasm of breast: Secondary | ICD-10-CM

## 2021-02-06 DIAGNOSIS — Z800 Family history of malignant neoplasm of digestive organs: Secondary | ICD-10-CM

## 2021-02-06 DIAGNOSIS — Z8041 Family history of malignant neoplasm of ovary: Secondary | ICD-10-CM

## 2021-02-06 NOTE — Progress Notes (Signed)
REFERRING PROVIDER: Lavena Bullion, DO [address] [place],  [place] [address]  PRIMARY PROVIDER:  Benito Mccreedy, MD  PRIMARY REASON FOR VISIT:  1. Family history of colon cancer   2. Family history of ovarian cancer   3. Family history of breast cancer   4. Family history of leukemia   5. Family history of lymphoma      HISTORY OF PRESENT ILLNESS:   Ms. Sandra Kelley, a 50 y.o. female, was seen for a [place] cancer genetics consultation at the request of Dr. Bryan Lemma due to a family history of cancer and her recent genetic testing through her GYN office.  [name] presents to clinic today to discuss the possibility of a hereditary predisposition to cancer, genetic testing, and to further clarify her future cancer risks, as well as potential cancer risks for family members.    Ms. West is a 50 y.o. female with no personal history of cancer.    CANCER HISTORY:  Oncology History   No history exists.     RISK FACTORS:  Menarche was at age 70.  First live birth at age 29.  OCP use for approximately 0 years.  Ovaries intact: yes.  Hysterectomy: yes.  Menopausal status: premenopausal.  HRT use: 0 years. Colonoscopy: yes; normal. Mammogram within the last year: yes. Number of breast biopsies: 0. Up to date with pelvic exams: yes. Any excessive radiation exposure in the past: no  Past Medical History:  Diagnosis Date  . 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 Anemia    past hx IDA  . DVT complicating pregnancy, third trimester 1991   right hip area  . DVT of lower extremity (deep venous thrombosis) ([place]) 1999   states behind right knee  . Family history of breast cancer   . Family history of colon cancer   . Family history of leukemia   . Family history of lymphoma   . Family history of ovarian cancer   . Hyperlipidemia     Past Surgical History:  Procedure Laterality Date  . PARTIAL  HYSTERECTOMY    . ROOT CANAL      Social History   Socioeconomic History  . Marital status: Single    Spouse name: Not on file  . Number of children: Not on file  . Years of education: Not on file  . Highest education level: Not on file  Occupational History  . Not on file  Tobacco Use  . Smoking status: Never Smoker  . Smokeless tobacco: Never Used  Vaping Use  . Vaping Use: Never used  Substance and Sexual Activity  . Alcohol use: Never  . Drug use: Never  . Sexual activity: Not on file  Other Topics Concern  . Not on file  Social History Narrative  . Not on file   Social Determinants of Health   Financial Resource Strain: Not on file  Food Insecurity: Not on file  Transportation Needs: Not on file  Physical Activity: Not on file  Stress: Not on file  Social Connections: Not on file     FAMILY HISTORY:  We obtained a detailed, 4-generation family history.  Significant diagnoses are listed below: Family History  Problem Relation Age of Onset  . Allergic rhinitis Sister   . Food Allergy Sister   . Colon cancer Maternal Aunt   . Colon cancer Maternal Uncle   . Colon  cancer Other   . Cervical cancer Mother 83  . Cancer Paternal Aunt   . Cancer Paternal Uncle   . Breast cancer Maternal Grandmother        dx under 62  . Bone cancer Maternal Grandmother   . Cancer Paternal Grandfather        unk type  . Ovarian cancer Maternal Aunt   . Lung cancer Maternal Aunt   . Lymphoma Maternal Aunt   . Leukemia Maternal Uncle   . Asthma Neg Hx   . Angioedema Neg Hx   . Eczema Neg Hx   . Immunodeficiency Neg Hx   . Urticaria Neg Hx   . Colon polyps Neg Hx   . Esophageal cancer Neg Hx   . Rectal cancer Neg Hx   . Stomach cancer Neg Hx    [name] has 1 daughter, 1 son, no cancers. She has 1 brother, 1 twin sister, and 1 maternal half sister, no cancers.  [name] mother died at 65 of cervical cancer. Patient had 4 maternal aunts, 3 maternal uncles. One aunt  had colon cancer under 57, another aunt had ovarian cancer and died in her 22s, an aunt had lung cancer and died in her 80s, and the final aunt is living in her 63s and has lymphoma. An uncle had colon cancer and died in his 42s, and another uncle had leukemia and died in his 71s. A maternal cousin had leukemia as well, and another cousin had cancer, unknown type. Maternal grandmother had breast cancer under 85 and bone cancer and died over 72.   Ms. Sutphen father is living at 95. Patient had 2 paternal uncles and 1 paternal aunt. One aunt and one uncle both died of cancer, unknown types, over age 19. No known cousins with cancer. Paternal grandfather had cancer, unknown type. Grandmother died over 66.   Ms. Boss is unaware of previous family history of genetic testing for hereditary cancer risks.  There is no reported Ashkenazi Jewish ancestry. There is no known consanguinity.   GENETIC COUNSELING ASSESSMENT: [name] is a 50 y.o. 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   We, therefore, discussed and recommended the following at today's visit.   We discussed that approximately 5-10% of cancer is hereditary. Based on her family history of colon, ovarian and breast cancer, she met criteria for testing. We discussed that testing is beneficial for several reasons including  knowing about cancer risks, identifying potential screening and risk-reduction options that may be appropriate, and to understand if other family members could be at risk for cancer and allow them to undergo genetic testing.   We reviewed the characteristics, features and inheritance patterns of hereditary cancer syndromes. We also discussed genetic testing, including the appropriate family members to test, the process of testing, insurance coverage and turn-around-time for results. [name] had genetic testing through her GYN provider, Dr. Wilhelmenia Blase.    GENETIC TEST RESULTS: Genetic testing reported out on  through the [employer] [facility] cancer panel found no pathogenic mutations.     The test report has been scanned into EPIC and is located under the Molecular Pathology section of the Results Review tab.  A portion of the result report is included below for reference.     We discussed with Ms. Pajak that because current genetic testing is not perfect, it is possible there may be a gene mutation in one  of these genes that current testing cannot detect, but that chance is small.  We also discussed, that there could be another gene that has not yet been discovered, or that we have not yet tested, that is responsible for the cancer diagnoses in the family. It is also possible there is a hereditary cause for the cancer in the family that Ms. Hunsinger did not inherit and therefore was not identified in her testing.  Therefore, it is important to remain in touch with cancer genetics in the future so that we can continue to offer Ms. Haro the most up to date genetic testing.   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  We discussed this finding in detail. At this time, it is unknown if this variant is associated with increased cancer risk or if this is a normal finding, but most variants such as this get reclassified to being inconsequential. It should not be used to make medical management decisions. With time, we suspect the lab will determine the significance of this variant, if any. If we do learn more about it, we will try to contact Ms. Branford to discuss it further. However, it is important to stay in touch with [place] periodically and keep the address and phone number up to date. Additionally, Ms. Iwan does not report having any other signs of Peutz Jeghers Syndrome, such as freckling, or Peutz-Jeghers-type hamartomatous polyps of her colon.   ADDITIONAL GENETIC TESTING: We discussed with Ms. Milliron  that her genetic testing was fairly extensive.  If there are genes identified to increase cancer risk that can be analyzed in the future, we would be happy to discuss and coordinate this testing at that time.  We also offered for her to have additional testing through the [facility] CancerNext-Expanded+RNA panel. The CancerNext-Expanded + RNAinsight gene panel offered by [employer] and includes sequencing and rearrangement analysis for the following 77 genes: IP, ALK, APC*, ATM*, AXIN2, BAP1, BARD1, BLM, BMPR1A, BRCA1*, BRCA2*, BRIP1*, CDC73, CDH1*,CDK4, CDKN1B, CDKN2A, CHEK2*, CTNNA1, DICER1, FANCC, FH, FLCN, GALNT12, KIF1B, LZTR1, MAX, MEN1, MET, MLH1*, MSH2*, MSH3, MSH6*, MUTYH*, NBN, NF1*, NF2, NTHL1, PALB2*, PHOX2B, PMS2*, POT1, PRKAR1A, PTCH1, PTEN*, RAD51C*, RAD51D*,RB1, RECQL, RET, SDHA, SDHAF2, SDHB, SDHC, SDHD, SMAD4, SMARCA4, SMARCB1, SMARCE1, STK11, SUFU, TMEM127, TP53*,TSC1, TSC2, VHL and XRCC2 (sequencing and deletion/duplication); EGFR, EGLN1, HOXB13, KIT, MITF, PDGFRA, POLD1 and POLE (sequencing only); EPCAM and GREM1 (deletion/duplication only).  CANCER SCREENING RECOMMENDATIONS: Ms. Meanor test result is considered negative (normal).  This means that we have not identified a hereditary cause for her  family history of cancer at this time.   While reassuring, this does not definitively rule out a hereditary predisposition to cancer. It is still possible that there could be genetic mutations that are undetectable by current technology. There could be genetic mutations in genes that have not been tested or identified to increase cancer risk.  Therefore, it is recommended she continue to follow the cancer management and screening guidelines provided by her  primary healthcare provider.   An individual's cancer risk and medical management are not determined by genetic test results alone. Overall cancer risk assessment incorporates additional factors, including personal medical history, family  history, and any available genetic information that may result in a personalized plan for cancer prevention and surveillance.  Based on Ms. Sundby's personal and family history of cancer as well as her genetic test results, risk model Harriett Rush was used to estimate her risk  of developing breast cancer. This estimates her lifetime risk of developing breast cancer to be approximately 15%.  The patient's lifetime breast cancer risk is a preliminary estimate based on available information using one of several models endorsed by the [place] (ACS). The ACS recommends consideration of breast MRI screening as an adjunct to mammography for patients at high risk (defined as 20% or greater lifetime risk).      RECOMMENDATIONS FOR FAMILY MEMBERS:  Relatives in this family might be at some increased risk of developing cancer, over the general population risk, simply due to the family history of cancer.  We recommended female relatives in this family have a yearly mammogram beginning at age 26, or 29 years younger than the earliest onset of cancer, an annual clinical breast exam, and perform monthly breast self-exams. Female relatives in this family should also have a gynecological exam as recommended by their primary provider.  All family members should be referred for colonoscopy starting at age 41.   It is also possible there is a hereditary cause for the cancer in Ms. Chestnutt's family that she did not inherit and therefore was not identified in her.  Based on Ms. Witt's family history, we recommended maternal relatives have genetic counseling and testing. Ms. Kesinger will let us know if we can be of any assistance in coordinating genetic counseling and/or testing for these family members.  FOLLOW-UP: Lastly, we discussed with Ms. Joiner that cancer genetics is a rapidly advancing field and it is possible that new genetic tests will be appropriate for her and/or her family members in the future. We  encouraged her to remain in contact with cancer genetics on an annual basis so we can update her personal and family histories and let her know of advances in cancer genetics that may benefit this family.   Ms. Shugrue questions were answered to her satisfaction today. Our contact information was provided should additional questions or concerns arise. Thank you for the referral and allowing [place] to share in the care of your patient.   Faith Rogue, MS, [facility] Genetic Counselor [email or web address] Phone: [phone]  The patient was seen for a total of 35 minutes in face-to-face genetic counseling.  Dr. Grayland Ormond was available for discussion regarding this case.   _______________________________________________________________________ For Office Staff:  Number of people involved in session: 1 Was an Intern/ student involved with case: no

## 2021-02-22 ENCOUNTER — Other Ambulatory Visit: Payer: Self-pay

## 2021-02-22 ENCOUNTER — Encounter: Payer: Self-pay | Admitting: Gastroenterology

## 2021-02-22 ENCOUNTER — Ambulatory Visit (AMBULATORY_SURGERY_CENTER): Payer: 59 | Admitting: Gastroenterology

## 2021-02-22 VITALS — BP 120/77 | HR 77 | Temp 95.7°F | Resp 16 | Ht 68.0 in | Wt 226.0 lb

## 2021-02-22 DIAGNOSIS — K2970 Gastritis, unspecified, without bleeding: Secondary | ICD-10-CM

## 2021-02-22 DIAGNOSIS — K269 Duodenal ulcer, unspecified as acute or chronic, without hemorrhage or perforation: Secondary | ICD-10-CM

## 2021-02-22 DIAGNOSIS — K2980 Duodenitis without bleeding: Secondary | ICD-10-CM

## 2021-02-22 DIAGNOSIS — K2990 Gastroduodenitis, unspecified, without bleeding: Secondary | ICD-10-CM

## 2021-02-22 DIAGNOSIS — R140 Abdominal distension (gaseous): Secondary | ICD-10-CM

## 2021-02-22 DIAGNOSIS — Z1501 Genetic susceptibility to malignant neoplasm of breast: Secondary | ICD-10-CM

## 2021-02-22 DIAGNOSIS — K2950 Unspecified chronic gastritis without bleeding: Secondary | ICD-10-CM | POA: Diagnosis not present

## 2021-02-22 MED ORDER — SODIUM CHLORIDE 0.9 % IV SOLN: 500.0000 mL | Freq: Once | INTRAVENOUS | Status: DC

## 2021-02-22 MED ORDER — OMEPRAZOLE 40 MG PO CPDR: 40.0000 mg | DELAYED_RELEASE_CAPSULE | Freq: Two times a day (BID) | ORAL | 3 refills | Status: AC

## 2021-02-22 NOTE — Progress Notes (Signed)
Called to room to assist during endoscopic procedure.  Patient ID and intended procedure confirmed with present staff. Received instructions for my participation in the procedure from the performing physician.  

## 2021-02-22 NOTE — Op Note (Signed)
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 Has been                            evaluated in the [facility] and the VUS not                            felt to convey any increased risk and therefore no                            recommendation for increased risk screening                            protocol at this juncture. Medicines:                Monitored Anesthesia Care Procedure:                Pre-Anesthesia Assessment:                           - Prior to the procedure, a History and Physical                            was performed, and patient medications and                            allergies were reviewed. The patient's tolerance of                            previous anesthesia was also reviewed. The risks                            and benefits of the procedure and the sedation                            options and risks were discussed with the patient.                            All questions were answered, and informed consent  was obtained. Prior Anticoagulants: The patient has                            taken no previous anticoagulant or antiplatelet                            agents. ASA Grade Assessment: II - A patient with                            mild systemic disease. After reviewing the risks                            and benefits, the patient was deemed in                            satisfactory condition to undergo the procedure.                            After obtaining informed consent, the endoscope was                            passed under direct vision. Throughout the                            procedure, the patient's blood pressure, pulse, and                            oxygen saturations were monitored continuously. The                            Endoscope was introduced through the mouth, and                            advanced to the third part of duodenum. The upper                            GI endoscopy was accomplished without difficulty.                            The patient tolerated the procedure well. Scope In: Scope Out: Findings:                 The examined esophagus was normal.                           The Z-line was regular and was found 40 cm from the                            incisors.                           Moderate inflammation characterized by erosions,                            erythema and shallow ulcerations  was found at the                            incisura, in the gastric antrum and in the                            prepyloric region of the stomach. Biopsies were                            taken with a cold forceps for Helicobacter pylori                            testing. Estimated blood loss was minimal.                           Scattered mild inflammation characterized by                            erythema was found in the gastric fundus and in the                            gastric body. Additional biopsies were taken with a                            cold forceps for Helicobacter pylori testing.                            Estimated blood loss was minimal.                           One non-bleeding duodenal ulcer with no stigmata of                            bleeding was found in the duodenal bulb. The lesion                            was 3 mm in largest dimension. Biopsies were taken                            with a cold forceps for histology. Estimated blood                             loss was minimal.                           The ampulla, second portion of the duodenum and                            third portion of the duodenum were normal. Complications:            No immediate complications. Estimated Blood Loss:     Estimated blood loss was minimal. Impression:               - Normal esophagus.                           -  Z-line regular, 40 cm from the incisors.                           - Gastritis. Biopsied.                           - Gastritis. Biopsied.                           - Non-bleeding duodenal ulcer with no stigmata of                            bleeding. Biopsied.                           - Normal ampulla, second portion of the duodenum                            and third portion of the duodenum. Recommendation:           - Patient has a contact number available for                            emergencies. The signs and symptoms of potential                            delayed complications were discussed with the                            patient. Return to normal activities tomorrow.                            Written discharge instructions were provided to the                            patient.                           - Resume previous diet.                           - Continue present medications.                           - Await pathology results.                           - Use Prilosec (omeprazole) 40 mg PO BID for 6                            weeks to promote mucosal healing, then reduce to 40                            mg daily and continue decreasing to lowest                            effective  dose or off completely if symptoms                            resolve. Doristine Locks, MD 02/22/2021 11:05:48 AM

## 2021-02-22 NOTE — Progress Notes (Signed)
History reviewed today 

## 2021-02-22 NOTE — Progress Notes (Signed)
Report to PACU, RN, vss, BBS= Clear.  

## 2021-02-22 NOTE — Patient Instructions (Signed)
Please read handouts provided. Continue present medications. Await pathology results. Begin Prilosec ( omeprazole ) 40 mg twice daily for 6 weeks, then reduce to 40 mg daily and continue decreasing to lowest effective dose or off completely if symptoms resolve.    YOU HAD AN ENDOSCOPIC PROCEDURE TODAY AT THE [facility]:   Refer to the procedure report that was given to you for any specific questions about what was found during the examination.  If the procedure report does not answer your questions, please call your gastroenterologist to clarify.  If you requested that your care partner not be given the details of your procedure findings, then the procedure report has been included in a sealed envelope for you to review at your convenience later.  YOU SHOULD EXPECT: Some feelings of bloating in the abdomen. Passage of more gas than usual.  Walking can help get rid of the air that was put into your GI tract during the procedure and reduce the bloating. If you had a lower endoscopy (such as a colonoscopy or flexible sigmoidoscopy) you may notice spotting of blood in your stool or on the toilet paper. If you underwent a bowel prep for your procedure, you may not have a normal bowel movement for a few days.  Please Note:  You might notice some irritation and congestion in your nose or some drainage.  This is from the oxygen used during your procedure.  There is no need for concern and it should clear up in a day or so.  SYMPTOMS TO REPORT IMMEDIATELY:   Following upper endoscopy (EGD)  Vomiting of blood or coffee ground material  New chest pain or pain under the shoulder blades  Painful or persistently difficult swallowing  New shortness of breath  Fever of 100F or higher  Black, tarry-looking stools  For urgent or emergent issues, a gastroenterologist can be reached at any hour by calling [phone]. Do not use MyChart messaging for urgent concerns.    DIET:  We do  recommend a small meal at first, but then you may proceed to your regular diet.  Drink plenty of fluids but you should avoid alcoholic beverages for 24 hours.  ACTIVITY:  You should plan to take it easy for the rest of today and you should NOT DRIVE or use heavy machinery until tomorrow (because of the sedation medicines used during the test).    FOLLOW UP: Our staff will call the number listed on your records 48-72 hours following your procedure to check on you and address any questions or concerns that you may have regarding the information given to you following your procedure. If we do not reach you, we will leave a message.  We will attempt to reach you two times.  During this call, we will ask if you have developed any symptoms of COVID 19. If you develop any symptoms (ie: fever, flu-like symptoms, shortness of breath, cough etc.) before then, please call [phone].  If you test positive for Covid 19 in the 2 weeks post procedure, please call and report this information to [place].    If any biopsies were taken you will be contacted by phone or by letter within the next 1-3 weeks.  Please call us at [phone] if you have not heard about the biopsies in 3 weeks.    SIGNATURES/CONFIDENTIALITY: You and/or your care partner have signed paperwork which will be entered into your electronic medical record.  These signatures attest to the fact that that  the information above on your After Visit Summary has been reviewed and is understood.  Full responsibility of the confidentiality of this discharge information lies with you and/or your care-partner.

## 2021-02-24 ENCOUNTER — Telehealth: Payer: Self-pay

## 2021-02-24 NOTE — Telephone Encounter (Signed)
No voicemail and unable to leave message on follow up call. 

## 2021-03-03 ENCOUNTER — Encounter: Payer: Self-pay | Admitting: Gastroenterology

## 2021-03-07 ENCOUNTER — Other Ambulatory Visit: Payer: Self-pay

## 2021-03-07 ENCOUNTER — Encounter (HOSPITAL_BASED_OUTPATIENT_CLINIC_OR_DEPARTMENT_OTHER): Payer: Self-pay

## 2021-03-07 ENCOUNTER — Emergency Department (HOSPITAL_BASED_OUTPATIENT_CLINIC_OR_DEPARTMENT_OTHER)
Admission: EM | Admit: 2021-03-07 | Discharge: 2021-03-07 | Disposition: A | Discharge status: Home / Self Care | Payer: 59 | Attending: Emergency Medicine | Admitting: Emergency Medicine

## 2021-03-07 DIAGNOSIS — R519 Headache, unspecified: Secondary | ICD-10-CM | POA: Diagnosis not present

## 2021-03-07 DIAGNOSIS — N12 Tubulo-interstitial nephritis, not specified as acute or chronic: Secondary | ICD-10-CM | POA: Insufficient documentation

## 2021-03-07 DIAGNOSIS — R300 Dysuria: Secondary | ICD-10-CM | POA: Diagnosis present

## 2021-03-07 LAB — CBC
HCT: 38.4 % (ref 36.0–46.0)
Hemoglobin: 12.5 g/dL (ref 12.0–15.0)
MCH: 28.6 pg (ref 26.0–34.0)
MCHC: 32.6 g/dL (ref 30.0–36.0)
MCV: 87.9 fL (ref 80.0–100.0)
Platelets: 391 10*3/uL (ref 150–400)
RBC: 4.37 MIL/uL (ref 3.87–5.11)
RDW: 12.5 % (ref 11.5–15.5)
WBC: 6.9 10*3/uL (ref 4.0–10.5)
nRBC: 0 % (ref 0.0–0.2)

## 2021-03-07 LAB — COMPREHENSIVE METABOLIC PANEL
ALT: 30 U/L (ref 0–44)
AST: 21 U/L (ref 15–41)
Albumin: 4 g/dL (ref 3.5–5.0)
Alkaline Phosphatase: 94 U/L (ref 38–126)
Anion gap: 7 (ref 5–15)
BUN: 6 mg/dL (ref 6–20)
CO2: 26 mmol/L (ref 22–32)
Calcium: 9.1 mg/dL (ref 8.9–10.3)
Chloride: 101 mmol/L (ref 98–111)
Creatinine, Ser: 0.8 mg/dL (ref 0.44–1.00)
GFR, Estimated: >60 mL/min (ref >60)
Glucose, Bld: 108 mg/dL — ABNORMAL HIGH (ref 70–99)
Potassium: 3.7 mmol/L (ref 3.5–5.1)
Sodium: 134 mmol/L — ABNORMAL LOW (ref 135–145)
Total Bilirubin: 0.5 mg/dL (ref 0.3–1.2)
Total Protein: 8 g/dL (ref 6.5–8.1)

## 2021-03-07 LAB — URINALYSIS, ROUTINE W REFLEX MICROSCOPIC
Bilirubin Urine: NEGATIVE
Glucose, UA: NEGATIVE mg/dL
Ketones, ur: NEGATIVE mg/dL
Nitrite: POSITIVE — AB
Protein, ur: 30 mg/dL — AB
Specific Gravity, Urine: 1.02 (ref 1.005–1.030)
pH: 6 (ref 5.0–8.0)

## 2021-03-07 LAB — URINALYSIS, MICROSCOPIC (REFLEX)

## 2021-03-07 MED ORDER — CEPHALEXIN 500 MG PO CAPS: 500.0000 mg | ORAL_CAPSULE | Freq: Two times a day (BID) | ORAL | 0 refills | Status: AC

## 2021-03-07 MED ORDER — PHENAZOPYRIDINE HCL 100 MG PO TABS: 100.0000 mg | ORAL_TABLET | Freq: Three times a day (TID) | ORAL | 0 refills | Status: AC | PRN

## 2021-03-07 NOTE — ED Provider Notes (Signed)
[facility] EMERGENCY DEPARTMENT Provider Note   CSN: [id number] Arrival date & time: 03/07/21  1156     History Chief Complaint  Patient presents with  . Dysuria    Sandra Kelley is a 50 y.o. female.  Patient reports that approximately 10 days ago she had sudden onset of abdominal pain, back pain bilaterally worse on the right than left, fevers, headaches, chills.  She test herself for COVID which came back negative but the pain is continued and has actually worsened in her abdomen and back.  She has been taking herbal supplements to help with what she thought was possibly a UTI but these have not helped.  The pain in her back continues to be worse in the right than the left.  She denies any recent UTIs.  Patient has not been taking anything for the pain or fevers.  She reports no recent fevers.  She also reports that after the initial COVID test she waited 4 days and took another COVID test which came back negative.        Past Medical History:  Diagnosis Date  . 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 Anemia    past hx IDA  . DVT complicating pregnancy, third trimester 1991   right hip area  . DVT of lower extremity (deep venous thrombosis) (HCC) 1999   states behind right knee  . Family history of breast cancer   . Family history of colon cancer   . Family history of leukemia   . Family history of lymphoma   . Family history of ovarian cancer     Patient Active Problem List   Diagnosis Date Noted  . Family history of colon cancer   . Family history of ovarian cancer   . Family history of breast cancer   . Family history of leukemia   . Family history of lymphoma   . Vitamin D deficiency 10/25/2020  . Obesity 10/25/2020  . Mixed hyperlipidemia 10/25/2020  . Food allergy 08/30/2020    Past Surgical History:  Procedure Laterality Date  . PARTIAL HYSTERECTOMY    . ROOT CANAL       OB History    No obstetric history on file.     Family History  Problem Relation Age of Onset  . Allergic rhinitis Sister   . Food Allergy Sister   . Colon cancer Maternal Aunt   . Colon cancer Maternal Uncle   . Colon cancer Other   . Cervical cancer Mother 55  . Cancer Paternal Aunt   . Cancer Paternal Uncle   . Breast cancer Maternal Grandmother        dx under 50  . Bone cancer Maternal Grandmother   . Cancer Paternal Grandfather        unk type  . Ovarian cancer Maternal Aunt   . Lung cancer Maternal Aunt   . Lymphoma Maternal Aunt   . Leukemia Maternal Uncle   . Asthma Neg Hx   . Angioedema Neg Hx   . Eczema Neg Hx   . Immunodeficiency Neg Hx   . Urticaria Neg Hx   . Colon polyps Neg Hx   . Esophageal cancer Neg Hx   . Rectal cancer Neg Hx   . Stomach cancer Neg Hx     Social History   Tobacco Use  . Smoking status: Never Smoker  . Smokeless tobacco: Never Used  Vaping Use  . Vaping Use: Never used  Substance Use Topics  . Alcohol use: Never  . Drug use: Never    Home Medications Prior to Admission medications   Medication Sig Start Date End Date Taking? Authorizing Provider  cephALEXin (KEFLEX) 500 MG capsule Take 1 capsule (500 mg total) by mouth 2 (two) times daily for 10 days. 03/07/21 03/17/21 Yes Derrel Nip, MD  aspirin EC 81 MG tablet Take 81 mg by mouth daily. Swallow whole.    [provider]  EPINEPHrine 0.3 mg/0.3 mL IJ SOAJ injection Use prn for allergic reaction and accidental ingestion of soy or tree nuts. Patient not taking: No sig reported 09/02/20   [provider]  omeprazole (PRILOSEC) 40 MG capsule Take 1 capsule (40 mg total) by mouth in the morning and at bedtime. Prilosec 40 mg twice daily for 6 weeks, then reduce to 40 mg daily and continue decreasing to lowest effective dose or off completely if symptoms resolve. 02/22/21   Cirigliano, Vito V, DO  phentermine (ADIPEX-P) 37.5 MG tablet Take 37.5 mg by mouth daily before  breakfast. Patient not taking: Reported on 02/22/2021 08/02/20   [provider]  Vitamin D, Ergocalciferol, (DRISDOL) 1.25 MG (50000 UNIT) CAPS capsule Take 1 capsule by mouth once a week. 01/19/21   [provider]    Allergies    Demerol [meperidine hcl] and Other  Review of Systems   Review of Systems  Constitutional: Positive for chills and fever.  HENT: Negative for congestion and rhinorrhea.   Eyes: Negative for visual disturbance.  Respiratory: Negative for choking and shortness of breath.   Cardiovascular: Negative for chest pain.  Gastrointestinal: Positive for abdominal pain. Negative for constipation, diarrhea, nausea and vomiting.  Endocrine: Positive for polyuria.  Genitourinary: Positive for dysuria and frequency.  Musculoskeletal: Positive for back pain.  Neurological: Positive for light-headedness. Negative for headaches.    Physical Exam Updated Vital Signs BP (!) 115/97 (BP Location: Left Arm)   Pulse 79   Temp 98.7 F (37.1 C) (Oral)   Resp 18   Ht 5\' 8"  (1.727 m)   Wt 102.1 kg   SpO2 100%   BMI 34.21 kg/m   Physical Exam Vitals reviewed.  Constitutional:      General: She is not in acute distress.    Appearance: Normal appearance.  HENT:     Head: Normocephalic and atraumatic.     Nose: Nose normal.     Mouth/Throat:     Mouth: Mucous membranes are moist.     Pharynx: No oropharyngeal exudate or posterior oropharyngeal erythema.  Cardiovascular:     Rate and Rhythm: Normal rate and regular rhythm.     Pulses: Normal pulses.     Heart sounds: Normal heart sounds.  Pulmonary:     Effort: Pulmonary effort is normal. No respiratory distress.  Abdominal:     General: Abdomen is flat.     Tenderness: There is abdominal tenderness. There is right CVA tenderness and left CVA tenderness.  Musculoskeletal:        General: Normal range of motion.     Cervical back: Normal range of motion and neck supple.  Skin:    General: Skin is  warm.     Capillary Refill: Capillary refill takes less than 2 seconds.  Neurological:     General: No focal deficit present.     Mental Status: She is alert.     ED Results / Procedures / Treatments  Labs (all labs ordered are listed, but only abnormal results are displayed) Labs Reviewed  URINALYSIS, ROUTINE W REFLEX MICROSCOPIC - Abnormal; Notable for the following components:      Result Value   APPearance CLOUDY (*)    Hgb urine dipstick LARGE (*)    Protein, ur 30 (*)    Nitrite POSITIVE (*)    Leukocytes,Ua MODERATE (*)    All other components within normal limits  URINALYSIS, MICROSCOPIC (REFLEX) - Abnormal; Notable for the following components:   Bacteria, UA FEW (*)    All other components within normal limits  COMPREHENSIVE METABOLIC PANEL - Abnormal; Notable for the following components:   Sodium 134 (*)    Glucose, Bld 108 (*)    All other components within normal limits  CBC    EKG None  Radiology No results found.  Procedures Procedures   Medications Ordered in ED Medications - No data to display  ED Course  I have reviewed the triage vital signs and the nursing notes.  Pertinent labs & imaging results that were available during my care of the patient were reviewed by me and considered in my medical decision making (see chart for details).    MDM Rules/Calculators/A&P                          Patient presents to the emergency department with 10-day history of back pain as well as lower abdominal pain.  Vital signs were stable and patient was afebrile on arrival.  UA showed moderate leukocytes, large hemoglobin, positive nitrites.  She has had a few bacteria.  Signs and symptoms consistent with pyelonephritis.  CBC and CMP ordered given duration of symptoms as well as patient reported fevers.  Patient was prescribed Keflex 500 mg twice daily for 10 days and was discharged with strict return precautions.  Patient is agreeable to this. Final Clinical  Impression(s) / ED Diagnoses Final diagnoses:  Dysuria  Pyelonephritis    Rx / DC Orders ED Discharge Orders         Ordered    cephALEXin (KEFLEX) 500 MG capsule  2 times daily        03/07/21 1406           Derrel Nip, MD 03/07/21 1459    Tegeler, Canary Brim, MD 03/07/21 2041

## 2021-03-07 NOTE — Discharge Instructions (Addendum)
It was wonderful seeing you today.  I am sorry you are having these issues with the urinary tract infection.  I think you have what is called pyelonephritis which is where the infection gets up into the kidneys.  We need to free to take antibiotics for at least 10 days.  We have sent a prescription for Keflex to your pharmacy.  If you have worsening symptoms any questions, concerns please either reach out to your primary care provider or come for further evaluation at the emergency department.  I hope you have a wonderful afternoon!

## 2021-03-07 NOTE — ED Triage Notes (Signed)
Pt c/o dysuria, freq started last night-flank bilat flank pain 2 days ago-NAD-steady gait

## 2021-03-21 ENCOUNTER — Other Ambulatory Visit: Payer: 59

## 2021-03-22 ENCOUNTER — Other Ambulatory Visit: Payer: Self-pay | Admitting: General Surgery

## 2021-03-22 DIAGNOSIS — K439 Ventral hernia without obstruction or gangrene: Secondary | ICD-10-CM

## 2021-04-06 ENCOUNTER — Other Ambulatory Visit: Payer: Self-pay

## 2021-04-06 ENCOUNTER — Ambulatory Visit
Admission: RE | Admit: 2021-04-06 | Discharge: 2021-04-06 | Disposition: A | Discharge status: Home / Self Care | Payer: 59 | Source: Ambulatory Visit | Attending: General Surgery | Admitting: General Surgery

## 2021-04-06 DIAGNOSIS — K439 Ventral hernia without obstruction or gangrene: Secondary | ICD-10-CM

## 2021-04-06 MED ORDER — IOPAMIDOL (ISOVUE-300) INJECTION 61%
100.0000 mL | Freq: Once | INTRAVENOUS | Status: AC | PRN
  Administered 2021-04-06: 100 mL via INTRAVENOUS

## 2022-10-20 IMAGING — MG MM DIGITAL DIAGNOSTIC UNILAT*L* W/ TOMO W/ CAD
4 series · 4 of 12 positions shown · non-contrast
Comparison: Baseline screening mammogram dated 08/23/2020.

CLINICAL DATA: Patient returns today to evaluate a possible LEFT
breast mass identified on recent screening mammogram.

EXAM:
DIGITAL DIAGNOSTIC LEFT MAMMOGRAM WITH CAD AND TOMO
ULTRASOUND LEFT BREAST

[L CC synth-2D]
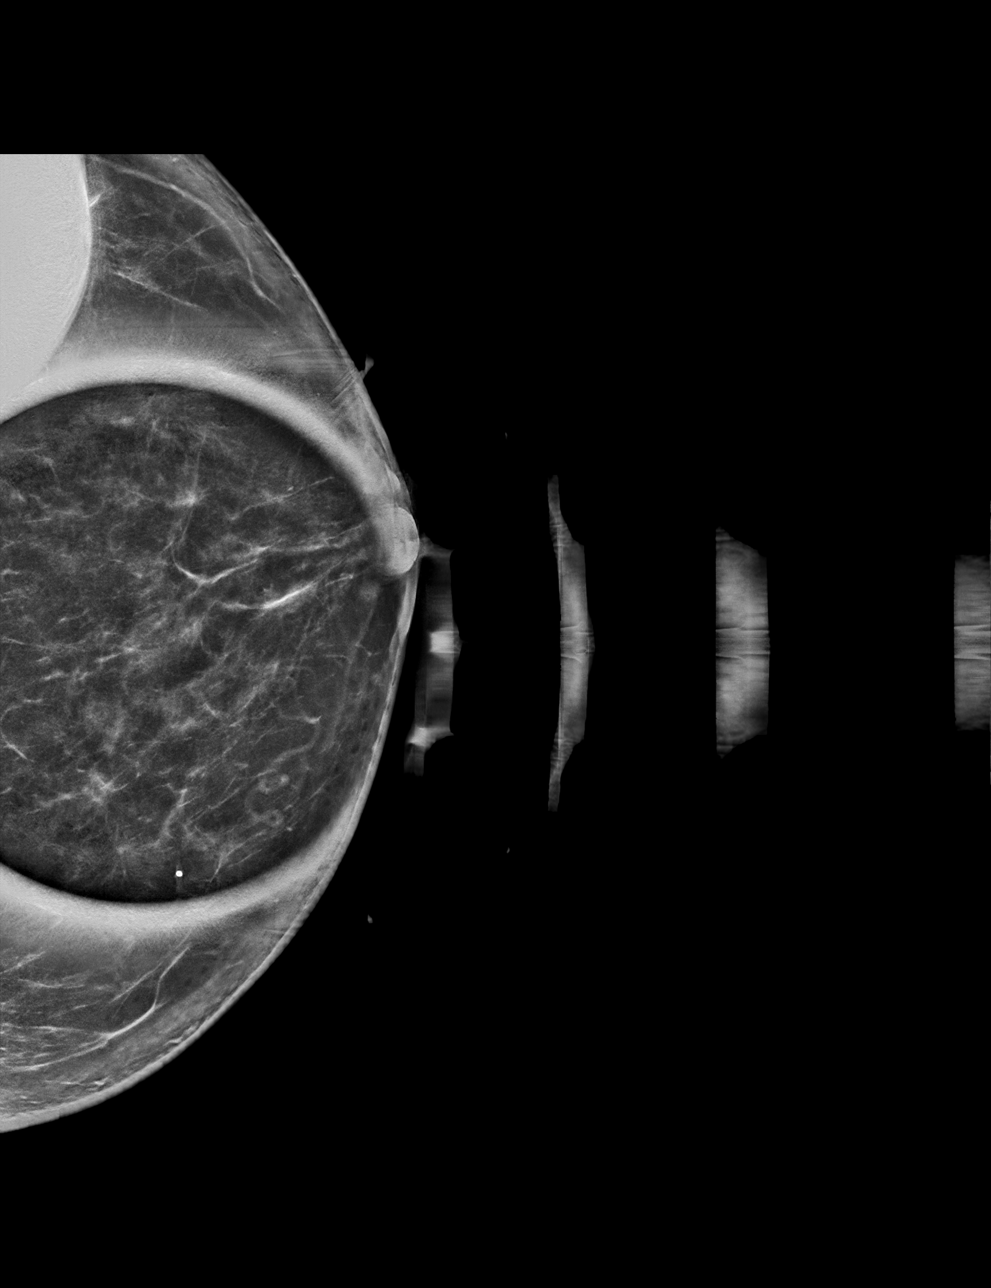

[L MLO synth-2D]
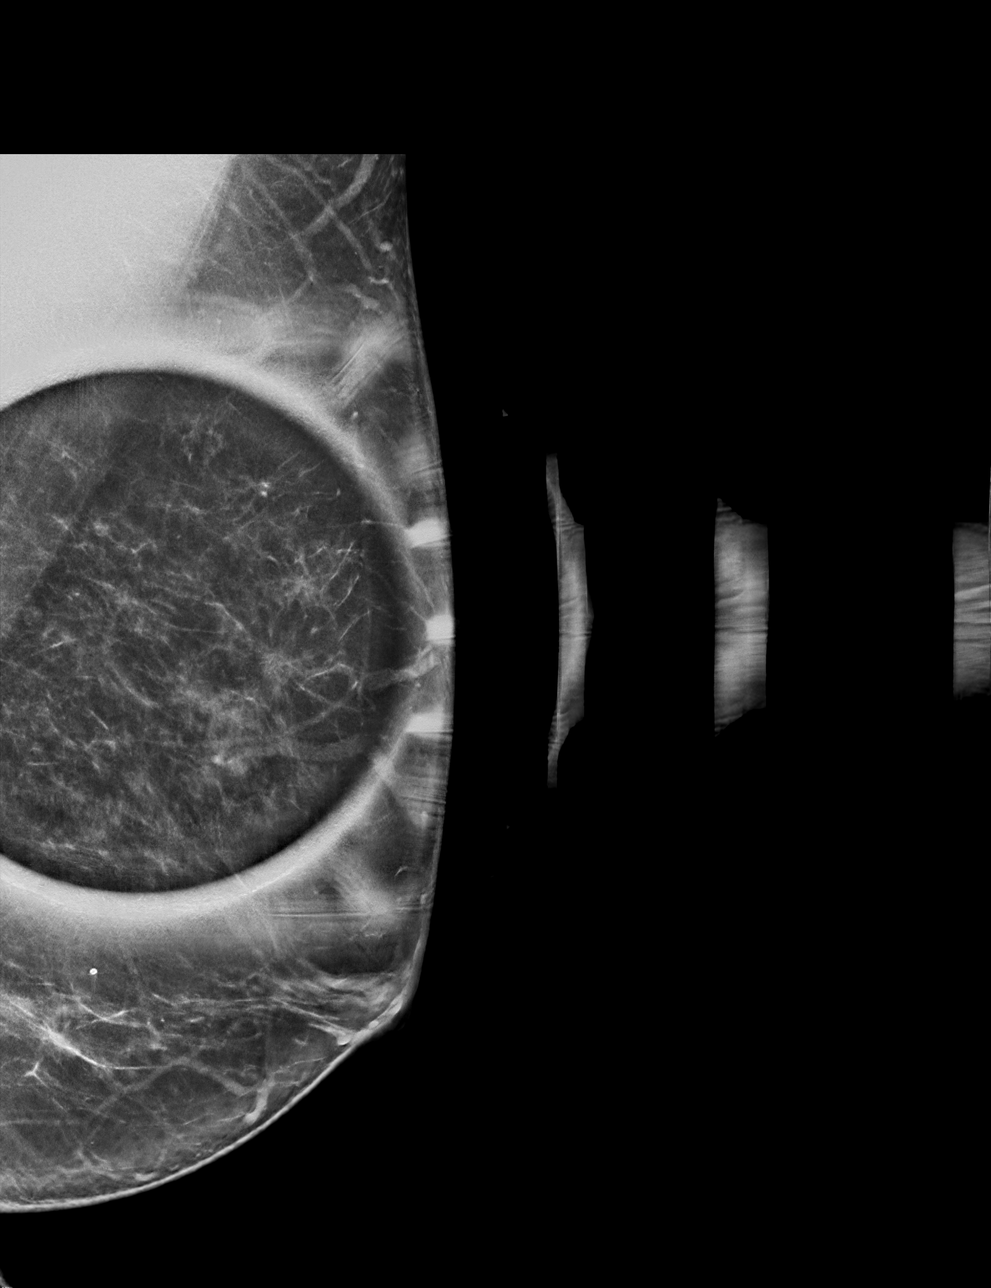

[L MLO tomo · tomo slice 39/78.0]
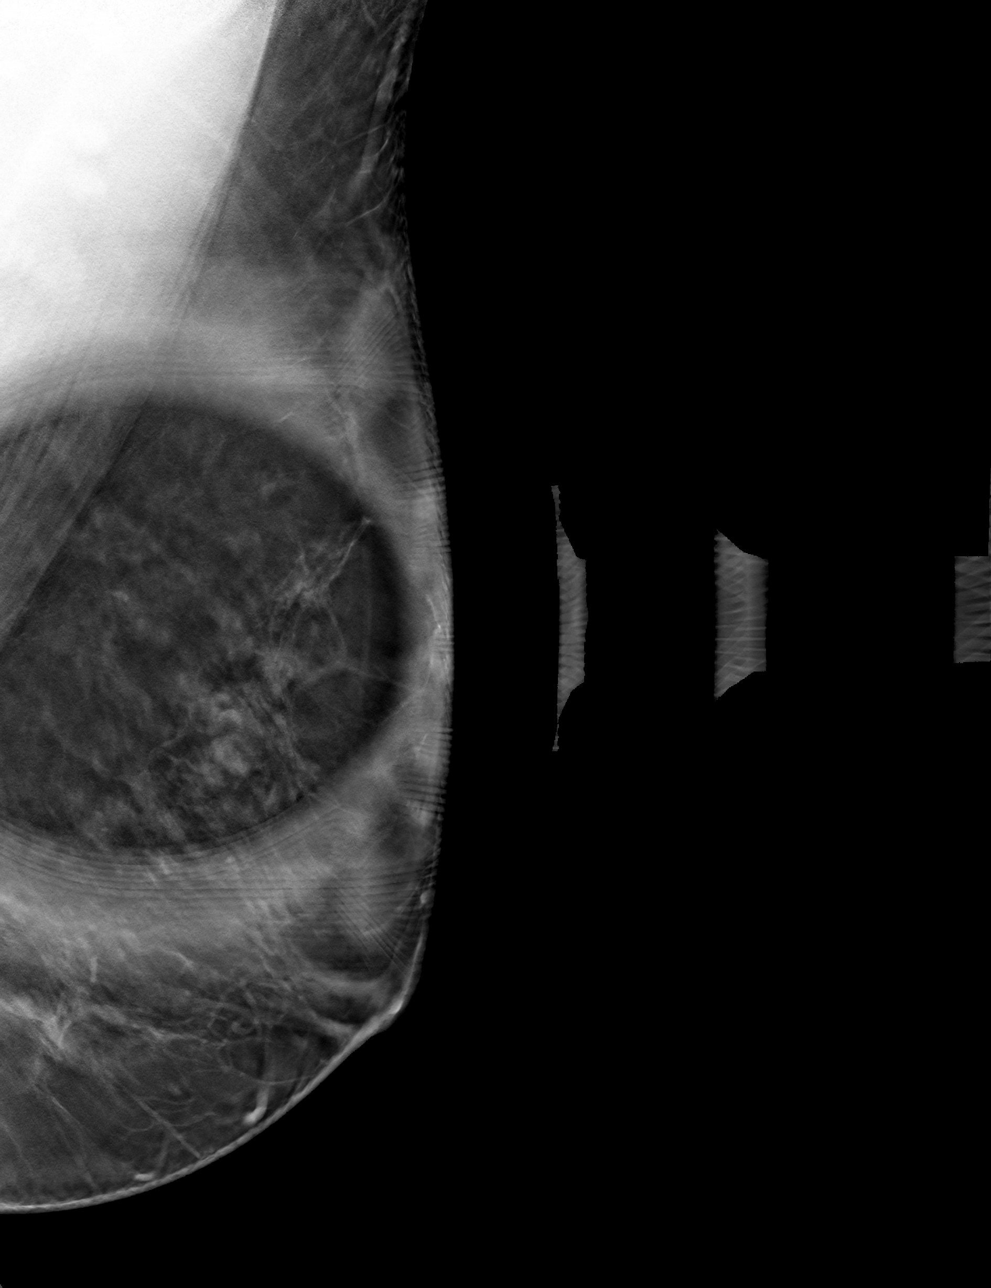

[L CC tomo · tomo slice 39/77.0]
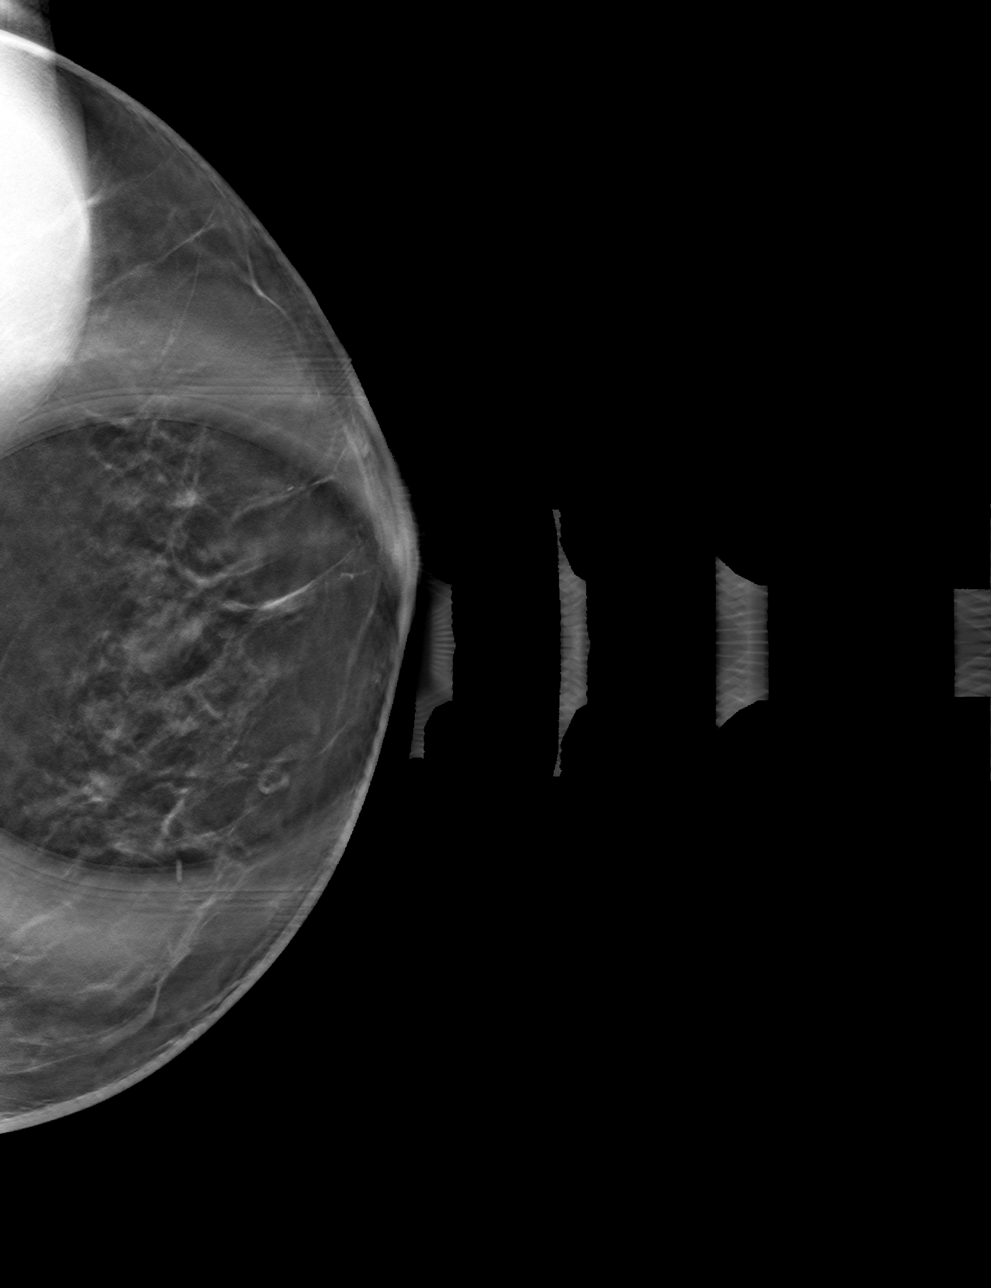

[4 of 12 positions shown; findings below may reference images not displayed]

ACR Breast Density Category c: The breast tissue is heterogeneously
dense, which may obscure small masses.
FINDINGS: There is a partially obscured mass confirmed within the upper LEFT
breast, at middle depth, 12 o'clock axis region, measuring
approximately 1.4 cm greatest dimension.

Mammographic images were processed with CAD.

Targeted ultrasound is performed, showing an oval circumscribed
hypoechoic mass in the LEFT breast at the 12 o'clock axis, 4 cm from
the nipple, measuring 1.5 x 0.4 x 1.3 cm, without internal
vascularity, corresponding to the mammographic finding, most likely
a benign fibroadenoma.

There is an additional oval circumscribed hypoechoic mass in the
LEFT breast at the 12:30 o'clock axis, measuring 0.8 x 0.3 x 0.7 cm,
also without internal vascularity, corresponding as an incidental
finding, also most likely a benign fibroadenoma.
IMPRESSION: Probably benign fibroadenomas within the LEFT breast at the 12
o'clock and 12:30 o'clock axes, measuring 1.5 cm and 0.8 cm
respectively, the larger corresponding to the mammographic finding.
Recommend follow-up LEFT breast ultrasound in 6 months to ensure
stability.

RECOMMENDATION:
LEFT breast ultrasound in 6 months.

I have discussed the findings and recommendations with the patient.
If applicable, a reminder letter will be sent to the patient
regarding the next appointment.

BI-RADS CATEGORY  3: Probably benign.

## 2023-05-07 IMAGING — CT CT ABD-PELV W/ CM
2 of 5 series · 14 of 46 positions shown, 16 images · IV contrast (iopamidol)
Comparison: None.

CLINICAL DATA: Ventral hernia. History of C-section and partial
hysterectomy.

EXAM:
CT ABDOMEN AND PELVIS WITH CONTRAST
TECHNIQUE: Multidetector CT imaging of the abdomen and pelvis was performed
using the standard protocol following bolus administration of
intravenous contrast.
CONTRAST:  100mL 9YDIXQ-800 IOPAMIDOL (9YDIXQ-800) INJECTION 61%

[Series 2: abd pelvis 5.00 br40 s3 axial · axial · 0.73mm/px · z∈[+1173,+1578]mm · 11 of 93 slices shown, 13 images]
[im 6/93  soft-tissue]
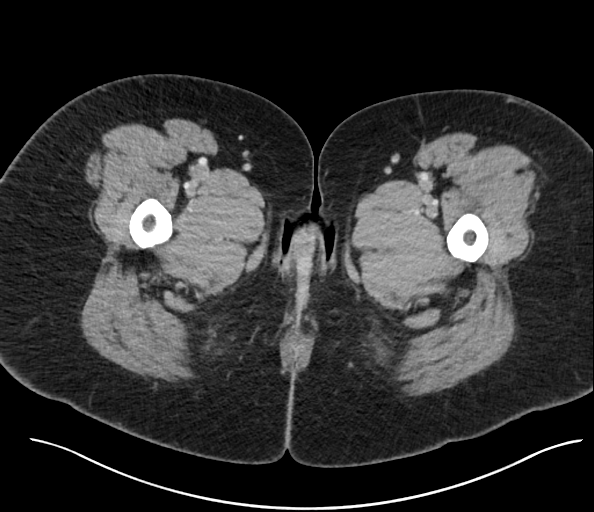
[im 6/93  bone]
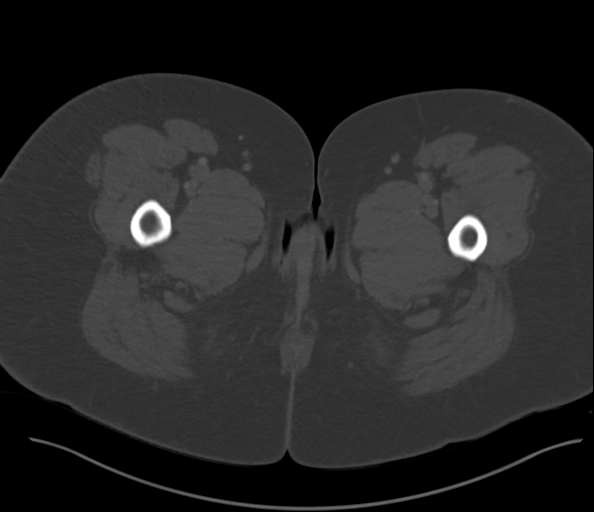
[im 16/93  soft-tissue]
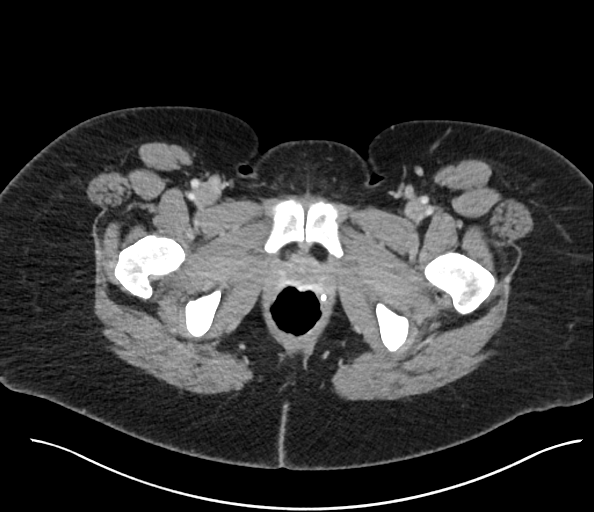
[im 21/93  soft-tissue]
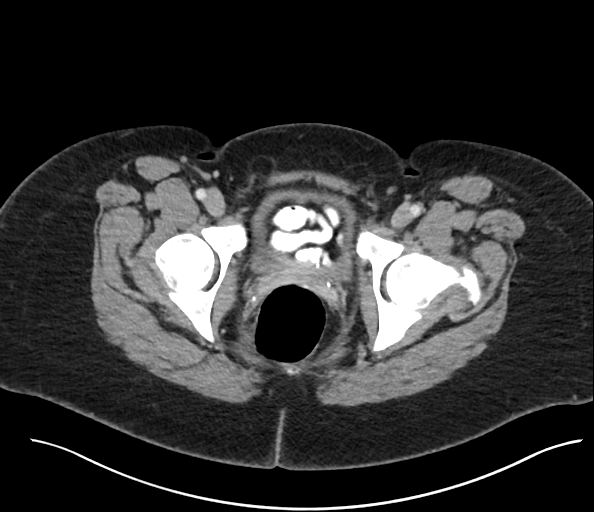
[im 31/93  soft-tissue]
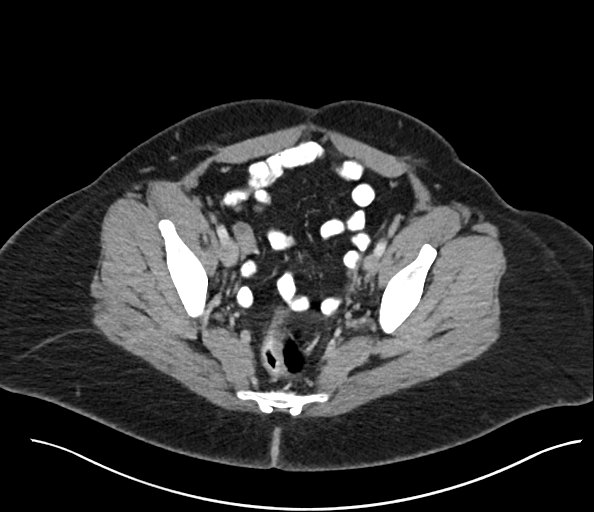
[im 36/93  soft-tissue]
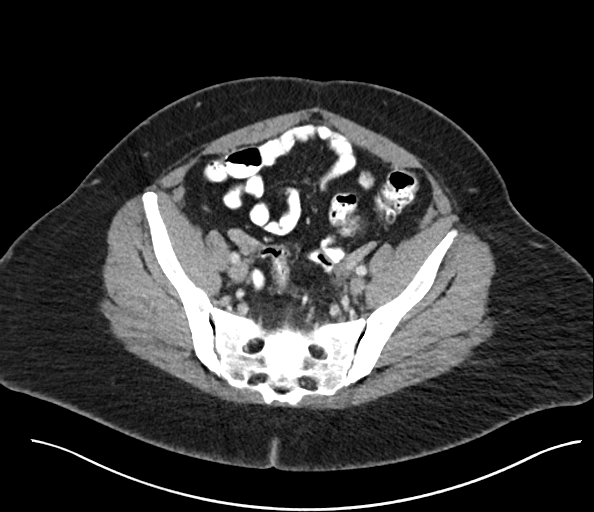
[im 47/93  soft-tissue]
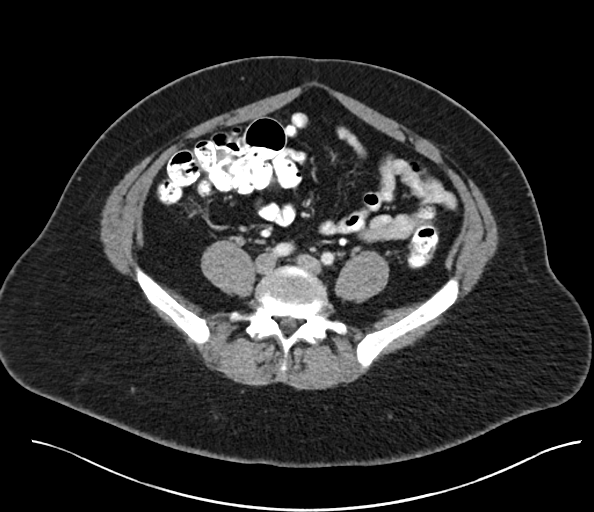
[im 57/93  soft-tissue]
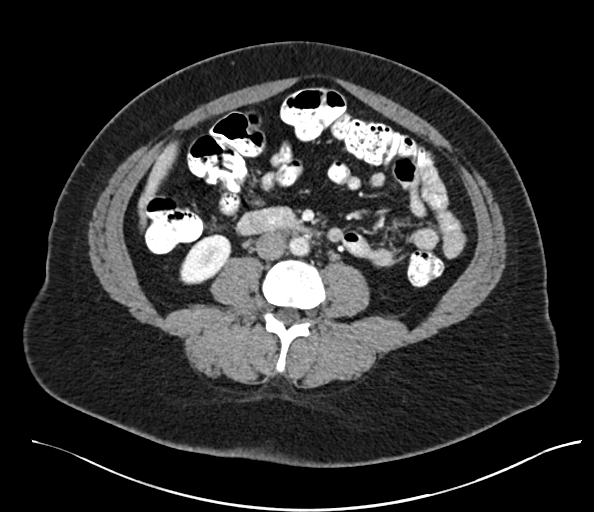
[im 62/93  soft-tissue]
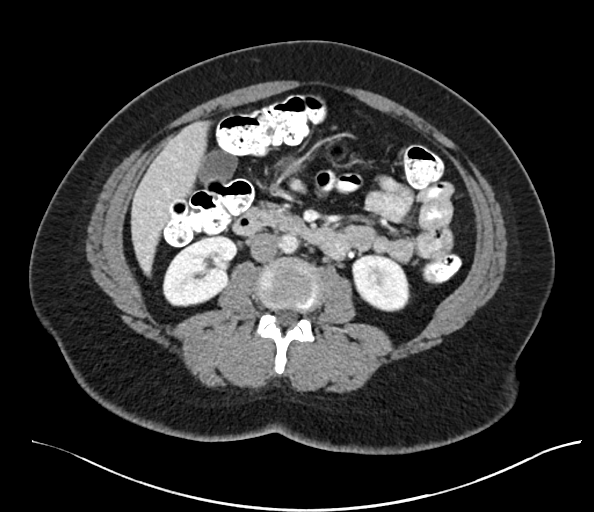
[im 72/93  soft-tissue]
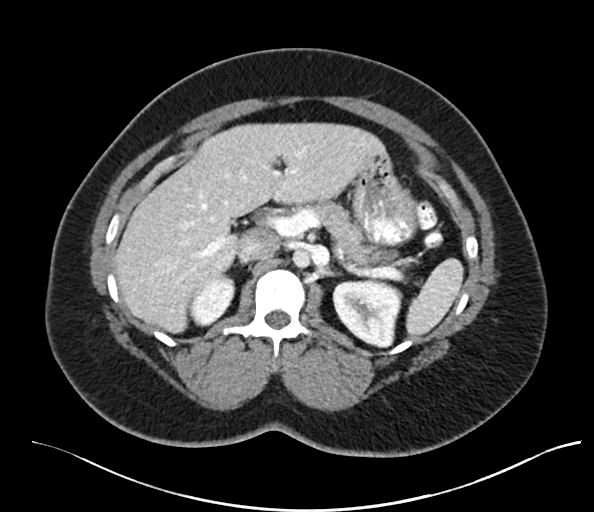
[im 72/93  bone]
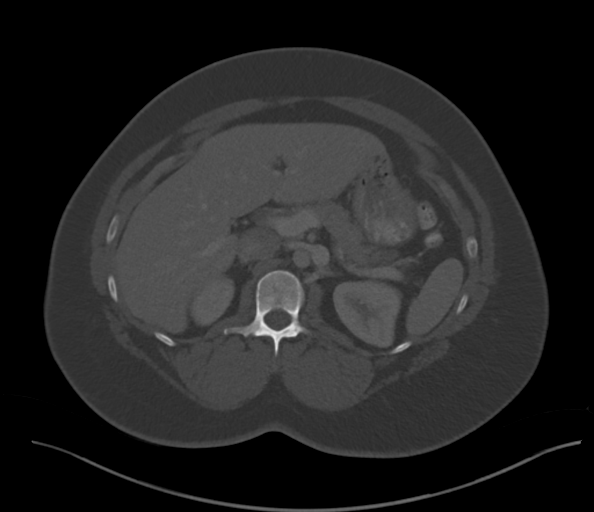
[im 77/93  soft-tissue]
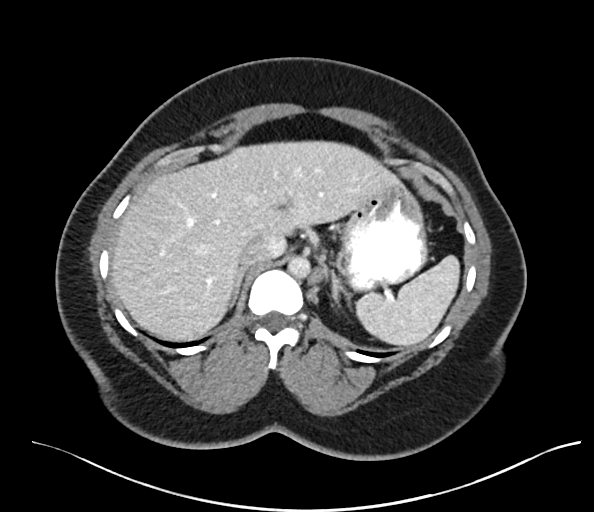
[im 87/93  soft-tissue]
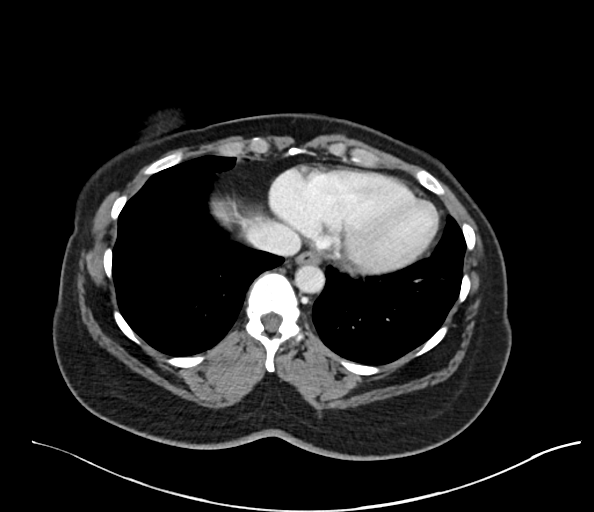

[Series 6: abd pelvis 2.00 br40 s3 cor · coronal · 0.85mm/px · 3 of 186 slices shown]
[im 62/186  soft-tissue]
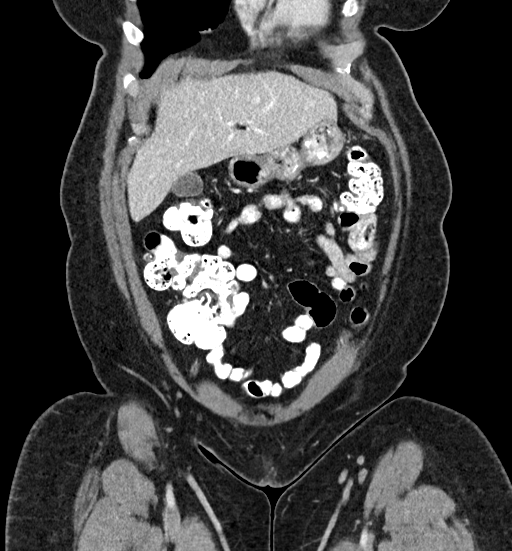
[im 83/186  soft-tissue]
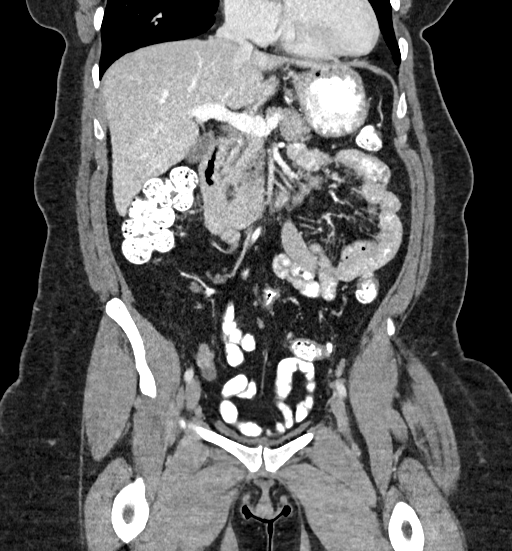
[im 103/186  soft-tissue]
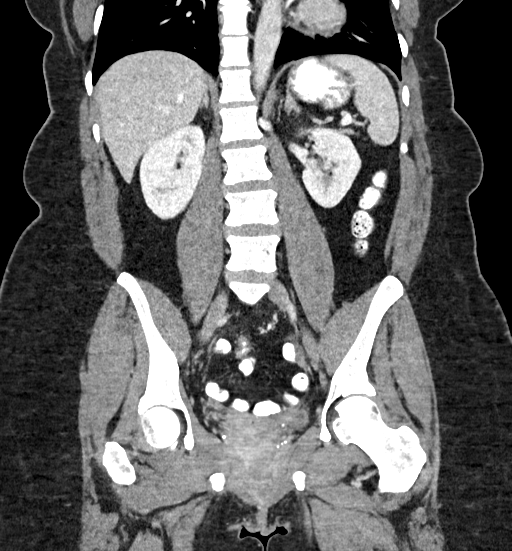

[14 of 46 positions shown; findings below may reference images not displayed]

FINDINGS: Lower chest: 6 x 3 mm subpleural nodule in the right lower lobe
(image [DATE]) has a morphology on the reformatted images suggesting a
subpleural lymph node. There is mild linear scarring or atelectasis
in the left lower lobe. The lung bases are otherwise clear. No
pleural or pericardial effusion.

Hepatobiliary: The liver is normal in density without suspicious
focal abnormality. No evidence of gallstones, gallbladder wall
thickening or biliary dilatation.

Pancreas: Unremarkable. No pancreatic ductal dilatation or
surrounding inflammatory changes.

Spleen: Normal in size without focal abnormality.

Adrenals/Urinary Tract: Both adrenal glands appear normal. Tiny
low-density right renal lesions are too small to characterize, but
are probably cysts. No evidence of urinary tract calculus or
hydronephrosis. The bladder is empty.

Stomach/Bowel: Enteric contrast was administered and has passed into
the distal colon. The stomach appears unremarkable for its degree of
distension. No evidence of bowel wall thickening, distention or
surrounding inflammatory change. The appendix appears normal. Mild
sigmoid diverticulosis.

Vascular/Lymphatic: There are no enlarged abdominal or pelvic lymph
nodes. No significant vascular findings.

Reproductive: Hysterectomy. Probable residual ovarian tissue
bilaterally without suspicious adnexal findings.

Other: Postsurgical changes in the low anterior abdominal wall with
a tiny umbilical hernia containing only fat. No other abdominal wall
hernia identified. There is no ascites.

Musculoskeletal: No acute or significant osseous findings.
Sacroiliac degenerative changes bilaterally.
IMPRESSION: 1. No significant ventral hernia identified. There are postsurgical
changes in the low anterior abdominal wall with a tiny umbilical
hernia containing only fat. Correlate clinically.
2. No acute findings.
